# Patient Record
Sex: Female | Born: 1955 | Race: Black or African American | Hispanic: No | Marital: Single | Smoking: Current every day smoker
Health system: Southern US, Community
[De-identification: ages and names within clinical notes are randomized; demographics above are authoritative.]

## PROBLEM LIST (undated history)

## (undated) DIAGNOSIS — I10 Essential (primary) hypertension: Secondary | ICD-10-CM

## (undated) DIAGNOSIS — J189 Pneumonia, unspecified organism: Secondary | ICD-10-CM

## (undated) DIAGNOSIS — Q782 Osteopetrosis: Secondary | ICD-10-CM

## (undated) DIAGNOSIS — K219 Gastro-esophageal reflux disease without esophagitis: Secondary | ICD-10-CM

## (undated) HISTORY — DX: Gastro-esophageal reflux disease without esophagitis: K21.9

---

## 1985-04-19 HISTORY — PX: TUBAL LIGATION: SHX77

## 2004-02-01 ENCOUNTER — Emergency Department: Payer: Self-pay | Admitting: Emergency Medicine

## 2006-10-27 ENCOUNTER — Emergency Department: Payer: Self-pay | Admitting: Emergency Medicine

## 2006-10-27 ENCOUNTER — Other Ambulatory Visit: Payer: Self-pay

## 2014-10-21 ENCOUNTER — Emergency Department: Payer: Self-pay

## 2014-10-21 ENCOUNTER — Encounter: Payer: Self-pay | Admitting: *Deleted

## 2014-10-21 ENCOUNTER — Emergency Department
Admission: EM | Admit: 2014-10-21 | Discharge: 2014-10-22 | Disposition: A | Discharge status: Home / Self Care | Payer: Self-pay | Attending: Emergency Medicine | Admitting: Emergency Medicine

## 2014-10-21 DIAGNOSIS — R079 Chest pain, unspecified: Secondary | ICD-10-CM

## 2014-10-21 DIAGNOSIS — M791 Myalgia, unspecified site: Secondary | ICD-10-CM

## 2014-10-21 DIAGNOSIS — Z792 Long term (current) use of antibiotics: Secondary | ICD-10-CM | POA: Insufficient documentation

## 2014-10-21 DIAGNOSIS — J189 Pneumonia, unspecified organism: Secondary | ICD-10-CM

## 2014-10-21 DIAGNOSIS — Z880 Allergy status to penicillin: Secondary | ICD-10-CM | POA: Insufficient documentation

## 2014-10-21 DIAGNOSIS — R0789 Other chest pain: Secondary | ICD-10-CM | POA: Insufficient documentation

## 2014-10-21 DIAGNOSIS — M549 Dorsalgia, unspecified: Secondary | ICD-10-CM | POA: Insufficient documentation

## 2014-10-21 DIAGNOSIS — J159 Unspecified bacterial pneumonia: Secondary | ICD-10-CM | POA: Insufficient documentation

## 2014-10-21 DIAGNOSIS — Z720 Tobacco use: Secondary | ICD-10-CM | POA: Insufficient documentation

## 2014-10-21 NOTE — ED Notes (Signed)
Pt to triage via wheelchair.  Pt has chest pain for 1 week.  Pain radiates into back.  Pt has a cough.   Reports coughing up clear phelgm   cig smoker.  No n/v/d.

## 2014-10-22 LAB — COMPREHENSIVE METABOLIC PANEL
ALT: 25 U/L (ref 14–54)
AST: 29 U/L (ref 15–41)
Albumin: 4.2 g/dL (ref 3.5–5.0)
Alkaline Phosphatase: 65 U/L (ref 38–126)
Anion gap: 11 (ref 5–15)
BILIRUBIN TOTAL: 1.2 mg/dL (ref 0.3–1.2)
BUN: 9 mg/dL (ref 6–20)
CO2: 27 mmol/L (ref 22–32)
CREATININE: 0.65 mg/dL (ref 0.44–1.00)
Calcium: 9.4 mg/dL (ref 8.9–10.3)
Chloride: 103 mmol/L (ref 101–111)
GFR calc Af Amer: >60 mL/min (ref >60)
Glucose, Bld: 94 mg/dL (ref 65–99)
POTASSIUM: 3.9 mmol/L (ref 3.5–5.1)
Sodium: 141 mmol/L (ref 135–145)
Total Protein: 7.8 g/dL (ref 6.5–8.1)

## 2014-10-22 LAB — CBC
HCT: 45.2 % (ref 35.0–47.0)
Hemoglobin: 15 g/dL (ref 12.0–16.0)
MCH: 31.6 pg (ref 26.0–34.0)
MCHC: 33.1 g/dL (ref 32.0–36.0)
MCV: 95.3 fL (ref 80.0–100.0)
Platelets: 119 10*3/uL — ABNORMAL LOW (ref 150–440)
RBC: 4.74 MIL/uL (ref 3.80–5.20)
RDW: 14.8 % — ABNORMAL HIGH (ref 11.5–14.5)
WBC: 5.2 10*3/uL (ref 3.6–11.0)

## 2014-10-22 LAB — FIBRIN DERIVATIVES D-DIMER (ARMC ONLY): FIBRIN DERIVATIVES D-DIMER (ARMC): 402 (ref 0–499)

## 2014-10-22 LAB — TROPONIN I
Troponin I: <0.03 ng/mL (ref <0.031)
Troponin I: <0.03 ng/mL (ref <0.031)

## 2014-10-22 MED ORDER — TRAMADOL HCL 50 MG PO TABS: 50.0000 mg | ORAL_TABLET | Freq: Four times a day (QID) | ORAL | Status: DC | PRN

## 2014-10-22 MED ORDER — AZITHROMYCIN 250 MG PO TABS
500.0000 mg | ORAL_TABLET | Freq: Once | ORAL | Status: AC
  Administered 2014-10-22: 500 mg via ORAL

## 2014-10-22 MED ORDER — AZITHROMYCIN 250 MG PO TABS: ORAL_TABLET | ORAL | Status: AC

## 2014-10-22 MED ORDER — AZITHROMYCIN 250 MG PO TABS
ORAL_TABLET | ORAL | Status: AC
  Administered 2014-10-22: 500 mg via ORAL
  Filled 2014-10-22: qty 2

## 2014-10-22 MED ORDER — KETOROLAC TROMETHAMINE 30 MG/ML IJ SOLN
30.0000 mg | Freq: Once | INTRAMUSCULAR | Status: AC
  Administered 2014-10-22: 30 mg via INTRAVENOUS

## 2014-10-22 MED ORDER — KETOROLAC TROMETHAMINE 30 MG/ML IJ SOLN
INTRAMUSCULAR | Status: AC
  Administered 2014-10-22: 30 mg via INTRAVENOUS
  Filled 2014-10-22: qty 1

## 2014-10-22 NOTE — ED Provider Notes (Signed)
[facility] Emergency Department Provider Note  ____________________________________________  Time seen: Approximately 2324 PM  I have reviewed the triage vital signs and the nursing notes.   HISTORY  Chief Complaint Chest Pain    HPI Rebecca Graham is a 59 y.o. female who comes in with chest pain 2-3 weeks. 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 That she has been taking [employer] bran pain reliever and Motrin but it has not been helping the pain. She reports that his pain at the top of her chest and she does also have some pain in her back. The patient reports that it hurts to cough as well as to breathe. She reports that she's had some sweats and hot flashes as well as a cough with some mild shortness of breath. The patient and her family were concerned so they decided to come in for further evaluation.   History reviewed. No pertinent past medical history.  There are no active problems to display for this patient.   History reviewed. No pertinent past surgical history.  Current Outpatient Rx  Name  Route  Sig  Dispense  Refill  . azithromycin (ZITHROMAX Z-PAK) 250 MG tablet      Take 2 tablets (500 mg) on  Day 1,  followed by 1 tablet (250 mg) once daily on Days 2 through 5.   6 each   0   . traMADol (ULTRAM) 50 MG tablet   Oral   Take 1 tablet (50 mg total) by mouth every 6 (six) hours as needed.   12 tablet   0     Allergies Aspirin and Penicillins  History reviewed. No pertinent family history.  Social History History  Substance Use Topics  . Smoking status: Current Every Day Smoker  . Smokeless tobacco: Not on file  . Alcohol Use: Yes    Review of Systems Constitutional: Sweats with No fever/chills Eyes: No visual changes. ENT: No sore throat. Cardiovascular: chest pain. Respiratory:  shortness of  breath. Gastrointestinal: No abdominal pain.  No nausea, no vomiting.  No diarrhea.  No constipation. Genitourinary: Negative for dysuria. Musculoskeletal: back pain and myalgias Skin: Negative for rash. Neurological: Negative for headaches  10-point ROS otherwise negative.  ____________________________________________   PHYSICAL EXAM:  VITAL SIGNS: ED Triage Vitals  Enc Vitals Group     BP 10/21/14 2305 83/57 mmHg     Pulse Rate 10/21/14 2305 82     Resp 10/21/14 2305 22     Temp 10/21/14 2305 98.2 F (36.8 C)     Temp Source 10/21/14 2305 Oral     SpO2 10/21/14 2305 96 %     Weight 10/21/14 2305 135 lb (61.236 kg)     Height 10/21/14 2305 5\' 5"  (1.651 m)     Head Cir --      Peak Flow --      Pain Score 10/21/14 2307 10     Pain Loc --      Pain Edu? --      Excl. in GC? --     Constitutional: Alert and oriented. Well appearing and in moderate distress. Eyes: Conjunctivae are normal. PERRL. EOMI. Head: Atraumatic. Nose: No congestion/rhinnorhea. Mouth/Throat: Mucous membranes are moist.  Oropharynx non-erythematous. Cardiovascular: Normal rate, regular rhythm. Grossly normal heart sounds.  Good peripheral circulation. Respiratory: Normal respiratory effort.  No  retractions. Lungs CTAB. Gastrointestinal: Soft and nontender. No distention. Active bowel sounds Genitourinary: Deferred Musculoskeletal: Tenderness to palpation of back and of chest wall Neurologic:  Normal speech and language. No gross focal neurologic deficits are appreciated.  Skin:  Skin is warm, dry and intact. No rash noted. Psychiatric: Mood and affect are normal.   ____________________________________________   LABS (all labs ordered are listed, but only abnormal results are displayed)  Labs Reviewed  CBC - Abnormal; Notable for the following:    RDW 14.8 (*)    Platelets 119 (*)    All other components within normal limits  COMPREHENSIVE METABOLIC PANEL  TROPONIN I  FIBRIN DERIVATIVES  D-DIMER ([facility] ONLY)  TROPONIN I   ____________________________________________  EKG  ED ECG REPORT I, Rebecka Apley, the attending physician, personally viewed and interpreted this ECG.   Date: 10/22/2014  EKG Time: 2304  Rate: 74  Rhythm: normal EKG, normal sinus rhythm  Axis: normal  Intervals:none  ST&T Change: normal  ____________________________________________  RADIOLOGY  Chest x-ray: Right mid lung airspace opacity raises concern for mild pneumonia ____________________________________________   PROCEDURES  Procedure(s) performed: None  Critical Care performed: No  ____________________________________________   INITIAL IMPRESSION / ASSESSMENT AND PLAN / ED COURSE  Pertinent labs & imaging results that were available during my care of the patient were reviewed by me and considered in my medical decision making (see chart for details).  The patient is a 59 year old female who comes in with chest pain for multiple weeks with ultimate other symptoms. Patient's blood work is unremarkable at this time. I did give the patient some Toradol for discomfort as well as some azithromycin for her pneumonia. The patient's d-dimer is also negative. I will reassess the patient and repeat a troponin to determine if this may be cardiac in nature. The patient's blood pressure after arrival to the room was unremarkable without intervention.  ----------------------------------------- 3:49 AM on 10/22/2014 -----------------------------------------  The patient reports that the Toradol did significantly improve her pain. Patient also received a dose of azithromycin without any ill effects. The patient did have a repeat troponin that was unremarkable. I informed the patient of the pneumonia found on her chest x-ray and the muscle aches that she feels is likely from the myalgias. The patient be discharged home and informed [facility] her  symptoms are improving. The patient has no further complaints or concerns over discharged home. ____________________________________________   FINAL CLINICAL IMPRESSION(S) / ED DIAGNOSES  Final diagnoses:  Chest pain, unspecified chest pain type  Community acquired pneumonia  Myalgia      Rebecka Apley, MD 10/22/14 [phone]

## 2014-10-22 NOTE — Discharge Instructions (Signed)
Chest Pain (Nonspecific) °It is often hard to give a specific diagnosis for the cause of chest pain. There is always a chance that your pain could be related to something serious, such as a heart attack or a blood clot in the lungs. You need to follow up with your health care provider for further evaluation. °CAUSES  °· Heartburn. °· Pneumonia or bronchitis. °· Anxiety or stress. °· Inflammation around your heart (pericarditis) or lung (pleuritis or pleurisy). °· A blood clot in the lung. °· A collapsed lung (pneumothorax). It can develop suddenly on its own (spontaneous pneumothorax) or from trauma to the chest. °· Shingles infection (herpes zoster virus). °The chest wall is composed of bones, muscles, and cartilage. Any of these can be the source of the pain. °· The bones can be bruised by injury. °· The muscles or cartilage can be strained by coughing or overwork. °· The cartilage can be affected by inflammation and become sore (costochondritis). °DIAGNOSIS  °Lab tests or other studies may be needed to find the cause of your pain. Your health care provider may have you take a test called an ambulatory electrocardiogram (ECG). An ECG records your heartbeat patterns over a 24-hour period. You may also have other tests, such as: °· Transthoracic echocardiogram (TTE). During echocardiography, sound waves are used to evaluate how blood flows through your heart. °· Transesophageal echocardiogram (TEE). °· Cardiac monitoring. This allows your health care provider to monitor your heart rate and rhythm in real time. °· Holter monitor. This is a portable device that records your heartbeat and can help diagnose heart arrhythmias. It allows your health care provider to track your heart activity for several days, if needed. °· Stress tests by exercise or by giving medicine that makes the heart beat faster. °TREATMENT  °· Treatment depends on what may be causing your chest pain. Treatment may include: °· Acid blockers for  heartburn. °· Anti-inflammatory medicine. °· Pain medicine for inflammatory conditions. °· Antibiotics if an infection is present. °· You may be advised to change lifestyle habits. This includes stopping smoking and avoiding alcohol, caffeine, and chocolate. °· You may be advised to keep your head raised (elevated) when sleeping. This reduces the chance of acid going backward from your stomach into your esophagus. °Most of the time, nonspecific chest pain will improve within 2-3 days with rest and mild pain medicine.  °HOME CARE INSTRUCTIONS  °· If antibiotics were prescribed, take them as directed. Finish them even if you start to feel better. °· For the next few days, avoid physical activities that bring on chest pain. Continue physical activities as directed. °· Do not use any tobacco products, including cigarettes, chewing tobacco, or electronic cigarettes. °· Avoid drinking alcohol. °· Only take medicine as directed by your health care provider. °· Follow your health care provider's suggestions for further testing if your chest pain does not go away. °· Keep any follow-up appointments you made. If you do not go to an appointment, you could develop lasting (chronic) problems with pain. If there is any problem keeping an appointment, call to reschedule. °SEEK MEDICAL CARE IF:  °· Your chest pain does not go away, even after treatment. °· You have a rash with blisters on your chest. °· You have a fever. °SEEK IMMEDIATE MEDICAL CARE IF:  °· You have increased chest pain or pain that spreads to your arm, neck, jaw, back, or abdomen. °· You have shortness of breath. °· You have an increasing cough, or you cough   up blood.  You have severe back or abdominal pain.  You feel nauseous or vomit.  You have severe weakness.  You faint.  You have chills. This is an emergency. Do not wait to see if the pain will go away. Get medical help at once. Call your local emergency services (911 in [place] Do not drive  yourself to the hospital. MAKE SURE YOU:   Understand these instructions.  Will watch your condition.  Will get help right away if you are not doing well or get worse. Document Released: 01/13/2005 Document Revised: 04/10/2013 Document Reviewed: 11/09/2007 [facility] Patient Information 2015 [employer], [place]. This information is not intended to replace advice given to you by your health care provider. Make sure you discuss any questions you have with your health care provider.  Muscle Pain Muscle pain (myalgia) may be caused by many things, including:  Overuse or muscle strain, especially if you are not in shape. This is the most common cause of muscle pain.  Injury.  Bruises.  Viruses, such as the flu.  Infectious diseases.  Fibromyalgia, which is a chronic condition that causes muscle tenderness, fatigue, and headache.  Autoimmune diseases, including lupus.  Certain drugs, including ACE inhibitors and statins. Muscle pain may be mild or severe. In most cases, the pain lasts only a short time and goes away without treatment. To diagnose the cause of your muscle pain, your health care provider will take your medical history. This means he or she will ask you when your muscle pain began and what has been happening. If you have not had muscle pain for very long, your health care provider may want to wait before doing much testing. If your muscle pain has lasted a long time, your health care provider may want to run tests right away. If your health care provider thinks your muscle pain may be caused by illness, you may need to have additional tests to rule out certain conditions.  Treatment for muscle pain depends on the cause. Home care is often enough to relieve muscle pain. Your health care provider may also prescribe anti-inflammatory medicine. HOME CARE INSTRUCTIONS Watch your condition for any changes. The following actions may help to lessen any discomfort you are feeling:  Only  take over-the-counter or prescription medicines as directed by your health care provider.  Apply ice to the sore muscle:  Put ice in a plastic bag.  Place a towel between your skin and the bag.  Leave the ice on for 15-20 minutes, 3-4 times a day.  You may alternate applying hot and cold packs to the muscle as directed by your health care provider.  If overuse is causing your muscle pain, slow down your activities until the pain goes away.  Remember that it is normal to feel some muscle pain after starting a workout program. Muscles that have not been used often will be sore at first.  Do regular, gentle exercises if you are not usually active.  Warm up before exercising to lower your risk of muscle pain.  Do not continue working out if the pain is very bad. Bad pain could mean you have injured a muscle. SEEK MEDICAL CARE IF:  Your muscle pain gets worse, and medicines do not help.  You have muscle pain that lasts longer than 3 days.  You have a rash or fever along with muscle pain.  You have muscle pain after a tick bite.  You have muscle pain while working out, even though you are  in good physical condition.  You have redness, soreness, or swelling along with muscle pain.  You have muscle pain after starting a new medicine or changing the dose of a medicine. SEEK IMMEDIATE MEDICAL CARE IF:  You have trouble breathing.  You have trouble swallowing.  You have muscle pain along with a stiff neck, fever, and vomiting.  You have severe muscle weakness or cannot move part of your body. MAKE SURE YOU:   Understand these instructions.  Will watch your condition.  Will get help right away if you are not doing well or get worse. Document Released: 02/25/2006 Document Revised: 04/10/2013 Document Reviewed: 01/30/2013 [facility] Patient Information 2015 [place], [place]. This information is not intended to replace advice given to you by your health care provider. Make sure you  discuss any questions you have with your health care provider.  Pneumonia Pneumonia is an infection of the lungs.  CAUSES Pneumonia may be caused by bacteria or a virus. Usually, these infections are caused by breathing infectious particles into the lungs (respiratory tract). SIGNS AND SYMPTOMS   Cough.  Fever.  Chest pain.  Increased rate of breathing.  Wheezing.  Mucus production. DIAGNOSIS  If you have the common symptoms of pneumonia, your health care provider will typically confirm the diagnosis with a chest X-ray. The X-ray will show an abnormality in the lung (pulmonary infiltrate) if you have pneumonia. Other tests of your blood, urine, or sputum may be done to find the specific cause of your pneumonia. Your health care provider may also do tests (blood gases or pulse oximetry) to see how well your lungs are working. TREATMENT  Some forms of pneumonia may be spread to other people when you cough or sneeze. You may be asked to wear a mask before and during your exam. Pneumonia that is caused by bacteria is treated with antibiotic medicine. Pneumonia that is caused by the influenza virus may be treated with an antiviral medicine. Most other viral infections must run their course. These infections will not respond to antibiotics.  HOME CARE INSTRUCTIONS   Cough suppressants may be used if you are losing too much rest. However, coughing protects you by clearing your lungs. You should avoid using cough suppressants if you can.  Your health care provider may have prescribed medicine if he or she thinks your pneumonia is caused by bacteria or influenza. Finish your medicine even if you start to feel better.  Your health care provider may also prescribe an expectorant. This loosens the mucus to be coughed up.  Take medicines only as directed by your health care provider.  Do not smoke. Smoking is a common cause of bronchitis and can contribute to pneumonia. If you are a smoker and  continue to smoke, your cough may last several weeks after your pneumonia has cleared.  A cold steam vaporizer or humidifier in your room or home may help loosen mucus.  Coughing is often worse at night. Sleeping in a semi-upright position in a recliner or using a couple pillows under your head will help with this.  Get rest as you feel it is needed. Your body will usually let you know when you need to rest. PREVENTION A pneumococcal shot (vaccine) is available to prevent a common bacterial cause of pneumonia. This is usually suggested for:  People over 64 years old.  Patients on chemotherapy.  People with chronic lung problems, such as bronchitis or emphysema.  People with immune system problems. If you are over 65 or  have a high risk condition, you may receive the pneumococcal vaccine if you have not received it before. In some countries, a routine influenza vaccine is also recommended. This vaccine can help prevent some cases of pneumonia.You may be offered the influenza vaccine as part of your care. If you smoke, it is time to quit. You may receive instructions on how to stop smoking. Your health care provider can provide medicines and counseling to help you quit. SEEK MEDICAL CARE IF: You have a fever. SEEK IMMEDIATE MEDICAL CARE IF:   Your illness becomes worse. This is especially true if you are elderly or weakened from any other disease.  You cannot control your cough with suppressants and are losing sleep.  You begin coughing up blood.  You develop pain which is getting worse or is uncontrolled with medicines.  Any of the symptoms which initially brought you in for treatment are getting worse rather than better.  You develop shortness of breath or chest pain. MAKE SURE YOU:   Understand these instructions.  Will watch your condition.  Will get help right away if you are not doing well or get worse. Document Released: 04/05/2005 Document Revised: 08/20/2013 Document  Reviewed: 06/25/2010 [facility] Patient Information 2015 [place], [place]. This information is not intended to replace advice given to you by your health care provider. Make sure you discuss any questions you have with your health care provider.

## 2014-10-24 ENCOUNTER — Ambulatory Visit: Payer: Self-pay

## 2014-10-25 ENCOUNTER — Other Ambulatory Visit: Payer: Self-pay | Admitting: Urology

## 2014-10-25 DIAGNOSIS — J189 Pneumonia, unspecified organism: Secondary | ICD-10-CM

## 2014-10-31 ENCOUNTER — Ambulatory Visit
Admission: RE | Admit: 2014-10-31 | Discharge: 2014-10-31 | Disposition: A | Discharge status: Home / Self Care | Payer: PRIVATE HEALTH INSURANCE | Source: Ambulatory Visit | Attending: Urology | Admitting: Urology

## 2014-10-31 DIAGNOSIS — J189 Pneumonia, unspecified organism: Secondary | ICD-10-CM | POA: Diagnosis present

## 2014-11-01 ENCOUNTER — Emergency Department: Payer: PRIVATE HEALTH INSURANCE

## 2014-11-01 ENCOUNTER — Other Ambulatory Visit: Payer: Self-pay

## 2014-11-01 ENCOUNTER — Emergency Department
Admission: EM | Admit: 2014-11-01 | Discharge: 2014-11-02 | Disposition: A | Discharge status: Home / Self Care | Payer: PRIVATE HEALTH INSURANCE | Attending: Emergency Medicine | Admitting: Emergency Medicine

## 2014-11-01 ENCOUNTER — Encounter: Payer: Self-pay | Admitting: Emergency Medicine

## 2014-11-01 DIAGNOSIS — Z87891 Personal history of nicotine dependence: Secondary | ICD-10-CM | POA: Insufficient documentation

## 2014-11-01 DIAGNOSIS — R091 Pleurisy: Secondary | ICD-10-CM

## 2014-11-01 DIAGNOSIS — R0602 Shortness of breath: Secondary | ICD-10-CM | POA: Insufficient documentation

## 2014-11-01 DIAGNOSIS — R0789 Other chest pain: Secondary | ICD-10-CM

## 2014-11-01 DIAGNOSIS — Z880 Allergy status to penicillin: Secondary | ICD-10-CM | POA: Insufficient documentation

## 2014-11-01 DIAGNOSIS — R110 Nausea: Secondary | ICD-10-CM | POA: Insufficient documentation

## 2014-11-01 LAB — COMPREHENSIVE METABOLIC PANEL
ALK PHOS: 71 U/L (ref 38–126)
ALT: 59 U/L — ABNORMAL HIGH (ref 14–54)
AST: 77 U/L — ABNORMAL HIGH (ref 15–41)
Albumin: 4.4 g/dL (ref 3.5–5.0)
Anion gap: 14 (ref 5–15)
BUN: 24 mg/dL — AB (ref 6–20)
CO2: 16 mmol/L — ABNORMAL LOW (ref 22–32)
Calcium: 9.5 mg/dL (ref 8.9–10.3)
Chloride: 101 mmol/L (ref 101–111)
Creatinine, Ser: 1.34 mg/dL — ABNORMAL HIGH (ref 0.44–1.00)
GFR calc Af Amer: 49 mL/min — ABNORMAL LOW (ref >60)
GFR, EST NON AFRICAN AMERICAN: 42 mL/min — AB (ref >60)
GLUCOSE: 164 mg/dL — AB (ref 65–99)
POTASSIUM: 3.3 mmol/L — AB (ref 3.5–5.1)
Sodium: 131 mmol/L — ABNORMAL LOW (ref 135–145)
Total Bilirubin: 0.5 mg/dL (ref 0.3–1.2)
Total Protein: 8.5 g/dL — ABNORMAL HIGH (ref 6.5–8.1)

## 2014-11-01 LAB — CBC
HEMATOCRIT: 42.9 % (ref 35.0–47.0)
Hemoglobin: 14 g/dL (ref 12.0–16.0)
MCH: 30.3 pg (ref 26.0–34.0)
MCHC: 32.6 g/dL (ref 32.0–36.0)
MCV: 92.8 fL (ref 80.0–100.0)
PLATELETS: 130 10*3/uL — AB (ref 150–440)
RBC: 4.62 MIL/uL (ref 3.80–5.20)
RDW: 14.7 % — ABNORMAL HIGH (ref 11.5–14.5)
WBC: 3.8 10*3/uL (ref 3.6–11.0)

## 2014-11-01 LAB — TROPONIN I: Troponin I: <0.03 ng/mL (ref <0.031)

## 2014-11-01 MED ORDER — POTASSIUM CHLORIDE CRYS ER 20 MEQ PO TBCR
40.0000 meq | EXTENDED_RELEASE_TABLET | Freq: Once | ORAL | Status: AC
  Administered 2014-11-01: 40 meq via ORAL
  Filled 2014-11-01: qty 2

## 2014-11-01 MED ORDER — HYDROCODONE-ACETAMINOPHEN 5-325 MG PO TABS
1.0000 | ORAL_TABLET | Freq: Once | ORAL | Status: AC
  Administered 2014-11-01: 1 via ORAL
  Filled 2014-11-01: qty 1

## 2014-11-01 MED ORDER — IBUPROFEN 600 MG PO TABS: 600.0000 mg | ORAL_TABLET | Freq: Three times a day (TID) | ORAL | Status: DC | PRN

## 2014-11-01 MED ORDER — METHYLPREDNISOLONE 4 MG PO TBPK: ORAL_TABLET | ORAL | Status: DC

## 2014-11-01 MED ORDER — HYDROCODONE-ACETAMINOPHEN 5-325 MG PO TABS: 1.0000 | ORAL_TABLET | Freq: Four times a day (QID) | ORAL | Status: DC | PRN

## 2014-11-01 MED ORDER — KETOROLAC TROMETHAMINE 30 MG/ML IJ SOLN
60.0000 mg | Freq: Once | INTRAMUSCULAR | Status: AC
  Administered 2014-11-01: 60 mg via INTRAMUSCULAR
  Filled 2014-11-01: qty 2

## 2014-11-01 NOTE — ED Notes (Signed)
Pt states left and central chest pain that began 2 days pta. Pt states is worse with movement, pt states was recently diagnosed with pneumonia and is currently coughing. Pt states feels shob. Pt states has had itnermittent nausea, denies lightheadedness.

## 2014-11-01 NOTE — Discharge Instructions (Signed)
1. Start Medrol Dosepak as prescribed. 2. Take pain medicines as needed (Motrin/Norco #15). 3. Apply moist heat to affected area several times daily. 4. Return to the ER for worsening symptoms, fever, difficulty breathing or other concerns.  Chest Pain (Nonspecific) It is often hard to give a diagnosis for the cause of chest pain. There is always a chance that your pain could be related to something serious, such as a heart attack or a blood clot in the lungs. You need to follow up with your doctor. HOME CARE  If antibiotic medicine was given, take it as directed by your doctor. Finish the medicine even if you start to feel better.  For the next few days, avoid activities that bring on chest pain. Continue physical activities as told by your doctor.  Do not use any tobacco products. This includes cigarettes, chewing tobacco, and e-cigarettes.  Avoid drinking alcohol.  Only take medicine as told by your doctor.  Follow your doctor's suggestions for more testing if your chest pain does not go away.  Keep all doctor visits you made. GET HELP IF:  Your chest pain does not go away, even after treatment.  You have a rash with blisters on your chest.  You have a fever. GET HELP RIGHT AWAY IF:   You have more pain or pain that spreads to your arm, neck, jaw, back, or belly (abdomen).  You have shortness of breath.  You cough more than usual or cough up blood.  You have very bad back or belly pain.  You feel sick to your stomach (nauseous) or throw up (vomit).  You have very bad weakness.  You pass out (faint).  You have chills. This is an emergency. Do not wait to see if the problems will go away. Call your local emergency services (911 in [place] Do not drive yourself to the hospital. MAKE SURE YOU:   Understand these instructions.  Will watch your condition.  Will get help right away if you are not doing well or get worse. Document Released: 09/22/2007 Document Revised:  04/10/2013 Document Reviewed: 09/22/2007 [facility] Patient Information 2015 [place], [place]. This information is not intended to replace advice given to you by your health care provider. Make sure you discuss any questions you have with your health care provider.  Chest Wall Pain Chest wall pain is pain in or around the bones and muscles of your chest. It may take up to 6 weeks to get better. It may take longer if you must stay physically active in your work and activities.  CAUSES  Chest wall pain may happen on its own. However, it may be caused by:  A viral illness like the flu.  Injury.  Coughing.  Exercise.  Arthritis.  Fibromyalgia.  Shingles. HOME CARE INSTRUCTIONS   Avoid overtiring physical activity. Try not to strain or perform activities that cause pain. This includes any activities using your chest or your abdominal and side muscles, especially if heavy weights are used.  Put ice on the sore area.  Put ice in a plastic bag.  Place a towel between your skin and the bag.  Leave the ice on for 15-20 minutes per hour while awake for the first 2 days.  Only take over-the-counter or prescription medicines for pain, discomfort, or fever as directed by your caregiver. SEEK IMMEDIATE MEDICAL CARE IF:   Your pain increases, or you are very uncomfortable.  You have a fever.  Your chest pain becomes worse.  You have new,  unexplained symptoms.  You have nausea or vomiting.  You feel sweaty or lightheaded.  You have a cough with phlegm (sputum), or you cough up blood. MAKE SURE YOU:   Understand these instructions.  Will watch your condition.  Will get help right away if you are not doing well or get worse. Document Released: 04/05/2005 Document Revised: 06/28/2011 Document Reviewed: 11/30/2010 [facility] Patient Information 2015 [place], [place]. This information is not intended to replace advice given to you by your health care provider. Make sure you discuss any  questions you have with your health care provider.  Pleurisy Pleurisy is an inflammation and swelling of the lining of the lungs (pleura). Because of this inflammation, it hurts to breathe. It can be aggravated by coughing, laughing, or deep breathing. Pleurisy is often caused by an underlying infection or disease.  HOME CARE INSTRUCTIONS  Monitor your pleurisy for any changes. The following actions may help to alleviate any discomfort you are experiencing:  Medicine may help with pain. Only take over-the-counter or prescription medicines for pain, discomfort, or fever as directed by your health care provider.  Only take antibiotic medicine as directed. Make sure to finish it even if you start to feel better. SEEK MEDICAL CARE IF:   Your pain is not controlled with medicine or is increasing.  You have an increase in pus-like (purulent) secretions brought up with coughing. SEEK IMMEDIATE MEDICAL CARE IF:   You have blue or dark lips, fingernails, or toenails.  You are coughing up blood.  You have increased difficulty breathing.  You have continuing pain unrelieved by medicine or pain lasting more than 1 week.  You have pain that radiates into your neck, arms, or jaw.  You develop increased shortness of breath or wheezing.  You develop a fever, rash, vomiting, fainting, or other serious symptoms. MAKE SURE YOU:  Understand these instructions.   Will watch your condition.   Will get help right away if you are not doing well or get worse.  Document Released: 04/05/2005 Document Revised: 12/06/2012 Document Reviewed: 09/17/2012 [facility] Patient Information 2015 [place]. This information is not intended to replace advice given to you by your health care provider. Make sure you discuss any questions you have with your health care provider.

## 2014-11-01 NOTE — ED Provider Notes (Signed)
[facility] Emergency Department Provider Note  ____________________________________________  Time seen: Approximately 11:07 PM  I have reviewed the triage vital signs and the nursing notes.   HISTORY  Chief Complaint Chest Pain    HPI Rebecca Graham is a 59 y.o. female who presents to the ED from home with central chest soreness 2 days. Patient was recently diagnosed on July 5 with pneumonia, finished Z-Pak. Patient complains of 8/10 chest aching which is worse with movement and nonproductive cough with occasional nausea and shortness of breath. Patient denies fever, chills, sweating, vomiting, palpitations.States tramadol does not alleviate chest pain.   Past medical history Denies CAD  There are no active problems to display for this patient.   No past surgical history on file.  Current Outpatient Rx  Name  Route  Sig  Dispense  Refill  . traMADol (ULTRAM) 50 MG tablet   Oral   Take 1 tablet (50 mg total) by mouth every 6 (six) hours as needed.   12 tablet   0     Allergies Aspirin and Penicillins  No family history on file.  Social History History  Substance Use Topics  . Smoking status: Former Games developer  . Smokeless tobacco: Not on file  . Alcohol Use: Yes    Review of Systems Constitutional: No fever/chills Eyes: No visual changes. ENT: No sore throat. Cardiovascular: Positive for chest pain. Respiratory: Positive for residual nonproductive cough which is overall improved and occasional shortness of breath. Gastrointestinal: No abdominal pain.  No nausea, no vomiting.  No diarrhea.  No constipation. Genitourinary: Negative for dysuria. Musculoskeletal: Negative for back pain. Skin: Negative for rash. Neurological: Negative for headaches, focal weakness or numbness.  10-point ROS otherwise negative.  ____________________________________________   PHYSICAL EXAM:  VITAL SIGNS: ED Triage Vitals  Enc Vitals Group     BP  11/01/14 2003 117/81 mmHg     Pulse Rate 11/01/14 2003 83     Resp 11/01/14 2003 18     Temp 11/01/14 2003 99 F (37.2 C)     Temp Source 11/01/14 2003 Oral     SpO2 11/01/14 2003 100 %     Weight 11/01/14 2003 145 lb (65.772 kg)     Height 11/01/14 2003  (1.702 m)     Head Cir --      Peak Flow --      Pain Score 11/01/14 2004 8     Pain Loc --      Pain Edu? --      Excl. in GC? --     Constitutional: Alert and oriented. Well appearing and in no acute distress. Eyes: Conjunctivae are normal. PERRL. EOMI. Head: Atraumatic. Nose: No congestion/rhinnorhea. Mouth/Throat: Mucous membranes are moist.  Oropharynx non-erythematous. Neck: No stridor.   Cardiovascular: Normal rate, regular rhythm. Grossly normal heart sounds.  Good peripheral circulation. Respiratory: Normal respiratory effort.  No retractions. Lungs CTAB. Anterior chest wall tender to palpation and with movement of trunk. Gastrointestinal: Soft and nontender. No distention. No abdominal bruits. No CVA tenderness. Musculoskeletal: No lower extremity tenderness nor edema.  No joint effusions. Neurologic:  Normal speech and language. No gross focal neurologic deficits are appreciated. No gait instability. Skin:  Skin is warm, dry and intact. No rash noted. Psychiatric: Mood and affect are normal. Speech and behavior are normal.  ____________________________________________   LABS (all labs ordered are listed, but only abnormal results are displayed)  Labs Reviewed  CBC - Abnormal; Notable for the following:  RDW 14.7 (*)    Platelets 130 (*)    All other components within normal limits  COMPREHENSIVE METABOLIC PANEL - Abnormal; Notable for the following:    Sodium 131 (*)    Potassium 3.3 (*)    CO2 16 (*)    Glucose, Bld 164 (*)    BUN 24 (*)    Creatinine, Ser 1.34 (*)    Total Protein 8.5 (*)    AST 77 (*)    ALT 59 (*)    GFR calc non Af Amer 42 (*)    GFR calc Af Amer 49 (*)    All other  components within normal limits  TROPONIN I   ____________________________________________  EKG  ED ECG REPORT I, Alize Acy J, the attending physician, personally viewed and interpreted this ECG.   Date: 11/01/2014  EKG Time: 1959  Rate: 87  Rhythm: normal EKG, normal sinus rhythm  Axis: Normal  Intervals:none  ST&T Change: Nonspecific  ____________________________________________  RADIOLOGY  Chest 2 view interpreted per Dr. Register:  Interim near complete clearing of right middle lobe pneumonia. ____________________________________________   PROCEDURES  Procedure(s) performed: None  Critical Care performed: No  ____________________________________________   INITIAL IMPRESSION / ASSESSMENT AND PLAN / ED COURSE  Pertinent labs & imaging results that were available during my care of the patient were reviewed by me and considered in my medical decision making (see chart for details).  59 year old female with complains of chest pain with recent pneumonia improved with azithromycin. Labs notable for mild hypokalemia. Chest pain is reproducible in nature; low suspicion for acute coronary syndrome or pulmonary embolus. Feel most likely pleurisy and spoke with patient regarding starting Medrol Dosepak in addition to NSAIDs and analgesics. Strict return precautions given. Patient and family verbalized understanding and agree with plan of care. ____________________________________________   FINAL CLINICAL IMPRESSION(S) / ED DIAGNOSES  Final diagnoses:  Pleurisy  Chest wall pain      Irean HongJade J Dastan Krider, MD 11/03/14 0327

## 2014-11-07 ENCOUNTER — Ambulatory Visit: Payer: Self-pay

## 2014-11-12 ENCOUNTER — Other Ambulatory Visit: Payer: Self-pay

## 2014-11-12 LAB — CBC AND DIFFERENTIAL
HCT: 40 % (ref 36–46)
Hemoglobin: 13 g/dL (ref 12.0–16.0)
Neutrophils Absolute: 3 /uL
Platelets: 228 10*3/uL (ref 150–399)
WBC: 5.6 10^3/mL

## 2014-11-12 LAB — HEPATIC FUNCTION PANEL
ALT: 41 U/L — AB (ref 7–35)
AST: 21 U/L (ref 13–35)
Alkaline Phosphatase: 61 U/L (ref 25–125)
BILIRUBIN, TOTAL: 0.4 mg/dL

## 2014-11-20 ENCOUNTER — Emergency Department: Payer: Worker's Compensation

## 2014-11-20 ENCOUNTER — Encounter: Payer: Self-pay | Admitting: *Deleted

## 2014-11-20 ENCOUNTER — Emergency Department
Admission: EM | Admit: 2014-11-20 | Discharge: 2014-11-20 | Disposition: A | Discharge status: Home / Self Care | Payer: Worker's Compensation | Attending: Emergency Medicine | Admitting: Emergency Medicine

## 2014-11-20 DIAGNOSIS — Y990 Civilian activity done for income or pay: Secondary | ICD-10-CM | POA: Insufficient documentation

## 2014-11-20 DIAGNOSIS — Y9389 Activity, other specified: Secondary | ICD-10-CM | POA: Diagnosis not present

## 2014-11-20 DIAGNOSIS — Y9289 Other specified places as the place of occurrence of the external cause: Secondary | ICD-10-CM | POA: Diagnosis not present

## 2014-11-20 DIAGNOSIS — W1839XA Other fall on same level, initial encounter: Secondary | ICD-10-CM | POA: Insufficient documentation

## 2014-11-20 DIAGNOSIS — S20212A Contusion of left front wall of thorax, initial encounter: Secondary | ICD-10-CM | POA: Insufficient documentation

## 2014-11-20 DIAGNOSIS — Z880 Allergy status to penicillin: Secondary | ICD-10-CM | POA: Insufficient documentation

## 2014-11-20 DIAGNOSIS — S299XXA Unspecified injury of thorax, initial encounter: Secondary | ICD-10-CM | POA: Diagnosis present

## 2014-11-20 DIAGNOSIS — Z720 Tobacco use: Secondary | ICD-10-CM | POA: Insufficient documentation

## 2014-11-20 HISTORY — DX: Pneumonia, unspecified organism: J18.9

## 2014-11-20 MED ORDER — HYDROCODONE-ACETAMINOPHEN 5-325 MG PO TABS
1.0000 | ORAL_TABLET | ORAL | Status: DC | PRN
Start: 2014-11-20 — End: 2017-07-26

## 2014-11-20 NOTE — ED Notes (Signed)
Pt reports while at work this morning she was lifting a heavy tub when it fell back onto her left side - pt now c/o left rib pain, concerned she may have a fracture. Pt grimacing and guarding, no acute distress, speaking complete sentences.

## 2014-11-20 NOTE — Discharge Instructions (Signed)
Chest Contusion A contusion is a deep bruise. Bruises happen when an injury causes bleeding under the skin. Signs of bruising include pain, puffiness (swelling), and discolored skin. The bruise may turn blue, purple, or yellow.  HOME CARE  Put ice on the injured area.  Put ice in a plastic bag.  Place a towel between the skin and the bag.  Leave the ice on for 15-20 minutes at a time, 03-04 times a day for the first 48 hours.  Only take medicine as told by your doctor.  Rest.  Take deep breaths (deep-breathing exercises) as told by your doctor.  Stop smoking if you smoke.  Do not lift objects over 5 pounds (2.3 kilograms) for 3 days or longer if told by your doctor. GET HELP RIGHT AWAY IF:   You have more bruising or puffiness.  You have pain that gets worse.  You have trouble breathing.  You are dizzy, weak, or pass out (faint).  You have blood in your pee (urine) or poop (stool).  You cough up or throw up (vomit) blood.  Your puffiness or pain is not helped with medicines. MAKE SURE YOU:   Understand these instructions.  Will watch your condition.  Will get help right away if you are not doing well or get worse. Document Released: 09/22/2007 Document Revised: 12/29/2011 Document Reviewed: 09/27/2011 [facility] Patient Information 2015 [place], [place]. This information is not intended to replace advice given to you by your health care provider. Make sure you discuss any questions you have with your health care provider.    ICE TO CHEST FOR COMFORT NORCO AS NEEDED FOR PAIN FOLLOW UP WITH YOUR DOCTOR IF ANY CONTINUED PROBLEMS IN ONE WEEK

## 2014-11-20 NOTE — ED Notes (Signed)
NAD noted at time of D/C. Pt taken to the lobby via wheelchair at time of D/C. Pt denies comments/concerns at this time.

## 2014-11-20 NOTE — ED Notes (Signed)
Company rep. Elijah Birk Scoggins advised the company required no post accident testing for [employer].

## 2014-11-20 NOTE — ED Provider Notes (Signed)
[facility] Emergency Department Provider Note  ____________________________________________  Time seen: 7:17 AM  I have reviewed the triage vital signs and the nursing notes.   HISTORY  Chief Complaint Rib Injury   HPI Rebecca Graham is a 59 y.o. female is here for his comp injury. Company representative is also here with the patient stating that there is no drug screen needed. This morning while at work patient bumped into a tub with her left ribs. There is concern for a rib fracture. There was no head injury or loss of consciousness. She states it does hurt to take deep breaths.She denies any prior rib fractures. Currently her pain is 9 out of 10. History is also positive for smoking one to 2-3 cigarettes per day.   Past Medical History  Diagnosis Date  . Pneumonia     There are no active problems to display for this patient.   History reviewed. No pertinent past surgical history.  Current Outpatient Rx  Name  Route  Sig  Dispense  Refill  . HYDROcodone-acetaminophen (NORCO/VICODIN) 5-325 MG per tablet   Oral   Take 1 tablet by mouth every 4 (four) hours as needed for moderate pain.   20 tablet   0   . methylPREDNISolone (MEDROL DOSEPAK) 4 MG TBPK tablet      Take as directed   21 tablet   0     Allergies Aspirin and Penicillins  History reviewed. No pertinent family history.  Social History History  Substance Use Topics  . Smoking status: Current Every Day Smoker    Types: Cigarettes  . Smokeless tobacco: Not on file  . Alcohol Use: No    Review of Systems Constitutional: No fever/chills Eyes: No visual changes. ENT: No sore throat. Cardiovascular: Positive left lateral chest pain. Respiratory: Positive shortness of breath due to increased pain. Gastrointestinal: No abdominal pain.  No nausea, no vomiting.  No diarrhea.  No constipation. Genitourinary: Negative for dysuria. Musculoskeletal: Negative for back pain. Skin:  Negative for rash. Neurological: Negative for headaches, focal weakness or numbness.  10-point ROS otherwise negative.  ____________________________________________   PHYSICAL EXAM:  VITAL SIGNS: ED Triage Vitals  Enc Vitals Group     BP 11/20/14 0703 114/84 mmHg     Pulse Rate 11/20/14 0703 67     Resp 11/20/14 0703 18     Temp 11/20/14 0703 97.5 F (36.4 C)     Temp Source 11/20/14 0703 Oral     SpO2 11/20/14 0703 100 %     Weight 11/20/14 0703 135 lb (61.236 kg)     Height 11/20/14 0703 5\' 7"  (1.702 m)     Head Cir --      Peak Flow --      Pain Score 11/20/14 0704 9     Pain Loc --      Pain Edu? --      Excl. in GC? --     Constitutional: Alert and oriented. Well appearing and in no acute distress. Eyes: Conjunctivae are normal. PERRL. EOMI. Head: Atraumatic. Nose: No congestion/rhinnorhea. Mouth/Throat: Mucous membranes are moist. Neck: No stridor.   Cardiovascular: Normal rate, regular rhythm. Grossly normal heart sounds.  Good peripheral circulation. Respiratory: Guarded respiratory effort.  No retractions. Lungs CTAB. Patient is markedly tender left lateral rib approximately seventh and eighth rib. There is no deformity in the area. No swelling or ecchymosis noted. Gastrointestinal: Soft and nontender. No distention. No abdominal bruits. No CVA tenderness. Musculoskeletal: No lower  extremity tenderness nor edema.  No joint effusions. Neurologic:  Normal speech and language. No gross focal neurologic deficits are appreciated. No gait instability. Skin:  Skin is warm, dry and intact. No rash noted. See note on skin above. Psychiatric: Mood and affect are normal. Speech and behavior are normal.  ____________________________________________   LABS (all labs ordered are listed, but only abnormal results are displayed)  Labs Reviewed - No data to display ____________________________________________ RADIOLOGY X-ray left ribs and chest showed no fractures per  radiologist. Beaulah Corin, personally viewed and evaluated these images as part of my medical decision making.     PROCEDURES  Procedure(s) performed: None  Critical Care performed: No  ____________________________________________   INITIAL IMPRESSION / ASSESSMENT AND PLAN / ED COURSE  Pertinent labs & imaging results that were available during my care of the patient were reviewed by me and considered in my medical decision making (see chart for details   patient was given a prescription for Norco. She is aware that she can only take this at night or when she is at home. She is also encouraged to use ice to the area at present. She was given a note with restrictions for work.   FINAL CLINICAL IMPRESSION(S) / ED DIAGNOSES  Final diagnoses:  Rib contusion, left, initial encounter      Tommi Rumps, PA-C 11/20/14 1610  Arnaldo Natal, MD 11/20/14 1726

## 2015-01-02 ENCOUNTER — Ambulatory Visit: Payer: Self-pay

## 2015-01-16 ENCOUNTER — Ambulatory Visit: Payer: Self-pay

## 2015-01-23 ENCOUNTER — Institutional Professional Consult (permissible substitution): Payer: Self-pay | Admitting: Licensed Clinical Social Worker

## 2015-01-30 ENCOUNTER — Ambulatory Visit: Payer: Self-pay

## 2015-01-30 ENCOUNTER — Ambulatory Visit: Payer: Self-pay | Admitting: Licensed Clinical Social Worker

## 2015-02-05 ENCOUNTER — Other Ambulatory Visit: Payer: Self-pay | Admitting: Urology

## 2015-02-05 DIAGNOSIS — R05 Cough: Secondary | ICD-10-CM

## 2015-02-05 DIAGNOSIS — R059 Cough, unspecified: Secondary | ICD-10-CM

## 2015-03-07 ENCOUNTER — Ambulatory Visit: Payer: Self-pay

## 2015-03-28 ENCOUNTER — Ambulatory Visit: Payer: Self-pay

## 2015-05-01 ENCOUNTER — Other Ambulatory Visit: Payer: Self-pay

## 2015-05-01 LAB — BASIC METABOLIC PANEL
BUN: 12 mg/dL (ref 4–21)
Creatinine: 0.8 mg/dL (ref <=1.1)
GLUCOSE: 75 mg/dL
POTASSIUM: 4 mmol/L (ref 3.4–5.3)
SODIUM: 138 mmol/L (ref 137–147)

## 2015-05-08 ENCOUNTER — Ambulatory Visit: Payer: Self-pay

## 2015-05-08 DIAGNOSIS — N3281 Overactive bladder: Secondary | ICD-10-CM | POA: Insufficient documentation

## 2015-05-08 DIAGNOSIS — I10 Essential (primary) hypertension: Secondary | ICD-10-CM | POA: Insufficient documentation

## 2015-06-13 DIAGNOSIS — N3281 Overactive bladder: Secondary | ICD-10-CM

## 2015-06-13 DIAGNOSIS — I10 Essential (primary) hypertension: Secondary | ICD-10-CM

## 2015-07-08 ENCOUNTER — Ambulatory Visit: Payer: Self-pay

## 2015-07-15 ENCOUNTER — Ambulatory Visit: Payer: Self-pay | Admitting: Urology

## 2015-07-15 VITALS — BP 127/74 | HR 65 | Wt 143.0 lb

## 2015-07-15 DIAGNOSIS — K219 Gastro-esophageal reflux disease without esophagitis: Secondary | ICD-10-CM

## 2015-07-15 DIAGNOSIS — I10 Essential (primary) hypertension: Secondary | ICD-10-CM

## 2015-07-15 DIAGNOSIS — N3281 Overactive bladder: Secondary | ICD-10-CM

## 2015-07-15 MED ORDER — HYDROCHLOROTHIAZIDE 25 MG PO TABS
25.0000 mg | ORAL_TABLET | Freq: Every day | ORAL | Status: DC
Start: 2015-07-15 — End: 2017-07-26

## 2015-07-15 MED ORDER — OXYBUTYNIN CHLORIDE 5 MG PO TABS: 5.0000 mg | ORAL_TABLET | Freq: Three times a day (TID) | ORAL | Status: DC

## 2015-07-15 MED ORDER — RANITIDINE HCL 150 MG PO TABS: 150.0000 mg | ORAL_TABLET | Freq: Two times a day (BID) | ORAL | Status: DC

## 2015-07-15 NOTE — Progress Notes (Signed)
       Patient: Rebecca Graham Female    DOB: 06/26/1955   60 y.o.   MRN: [id number] Visit Date: 07/15/2015  Today's Provider: [facility]   Chief Complaint  Patient presents with  . lab results   Subjective:    Hypertension This is a chronic problem. The current episode started more than 1 year ago. The problem is unchanged. The problem is controlled. Pertinent negatives include no anxiety, blurred vision, chest pain, headaches, malaise/fatigue, neck pain, orthopnea, palpitations, peripheral edema, PND, shortness of breath or sweats. There are no associated agents to hypertension. Risk factors for coronary artery disease include smoking/tobacco exposure and post-menopausal state. Past treatments include diuretics. The current treatment provides significant improvement. Compliance problems include psychosocial issues.    Symptoms of GERD every day.    Allergies  Allergen Reactions  . Aspirin Rash  . Penicillins Rash   Previous Medications   ACETAMINOPHEN (TYLENOL) 500 MG TABLET    Take 500 mg by mouth every 6 (six) hours as needed.   HYDROCHLOROTHIAZIDE (HYDRODIURIL) 25 MG TABLET    Take 25 mg by mouth daily.   HYDROCODONE-ACETAMINOPHEN (NORCO/VICODIN) 5-325 MG PER TABLET    Take 1 tablet by mouth every 4 (four) hours as needed for moderate pain.   METHYLPREDNISOLONE (MEDROL DOSEPAK) 4 MG TBPK TABLET    Take as directed   OXYBUTYNIN (DITROPAN) 5 MG TABLET    Take 5 mg by mouth 3 (three) times daily. Reported on 07/15/2015    Review of Systems  Constitutional: Negative for malaise/fatigue.  Eyes: Negative for blurred vision.  Respiratory: Negative for shortness of breath.   Cardiovascular: Negative for chest pain, palpitations, orthopnea and PND.  Musculoskeletal: Negative for neck pain.  Neurological: Negative for headaches.    Social History  Substance Use Topics  . Smoking status: Current Every Day Smoker -- 1.00 packs/day for 45 years    Types: Cigarettes    . Smokeless tobacco: Never Used  . Alcohol Use: 1.2 oz/week    2 Cans of beer per week   Objective:   BP 127/74 mmHg  Pulse 65  Wt 143 lb (64.864 kg)  Physical Exam  Constitutional: She is oriented to person, place, and time. She appears well-developed and well-nourished.  HENT:  Head: Normocephalic and atraumatic.  Cardiovascular: Normal rate and regular rhythm.   Pulmonary/Chest: Effort normal and breath sounds normal.  Abdominal: Soft. Bowel sounds are normal.  Musculoskeletal: Normal range of motion.  Neurological: She is alert and oriented to person, place, and time. She has normal reflexes.  Skin: Skin is warm and dry.  Psychiatric: She has a normal mood and affect. Her behavior is normal. Judgment and thought content normal.        Assessment & Plan:     1. HTN:   Good control.  Continue HCTZ.  2. GERD:  Start Zantac 150 mg bid, follow up in 6 weeks  3. OAB:   Restart oxybutynin, follow up in 6 weeks      [facility]  [facility]

## 2015-08-28 ENCOUNTER — Ambulatory Visit: Payer: Self-pay

## 2016-06-25 IMAGING — CR DG CHEST 2V
1 series · 2 of 2 positions shown · non-contrast
Comparison: None.

CLINICAL DATA: Acute onset of chest pain, radiating to the back.
Cough. Initial encounter.

EXAM:
CHEST  2 VIEW

[Series 1: dg chest 2 view · 0.14mm/px · 2 of 2 slices shown]
[im 1/2]
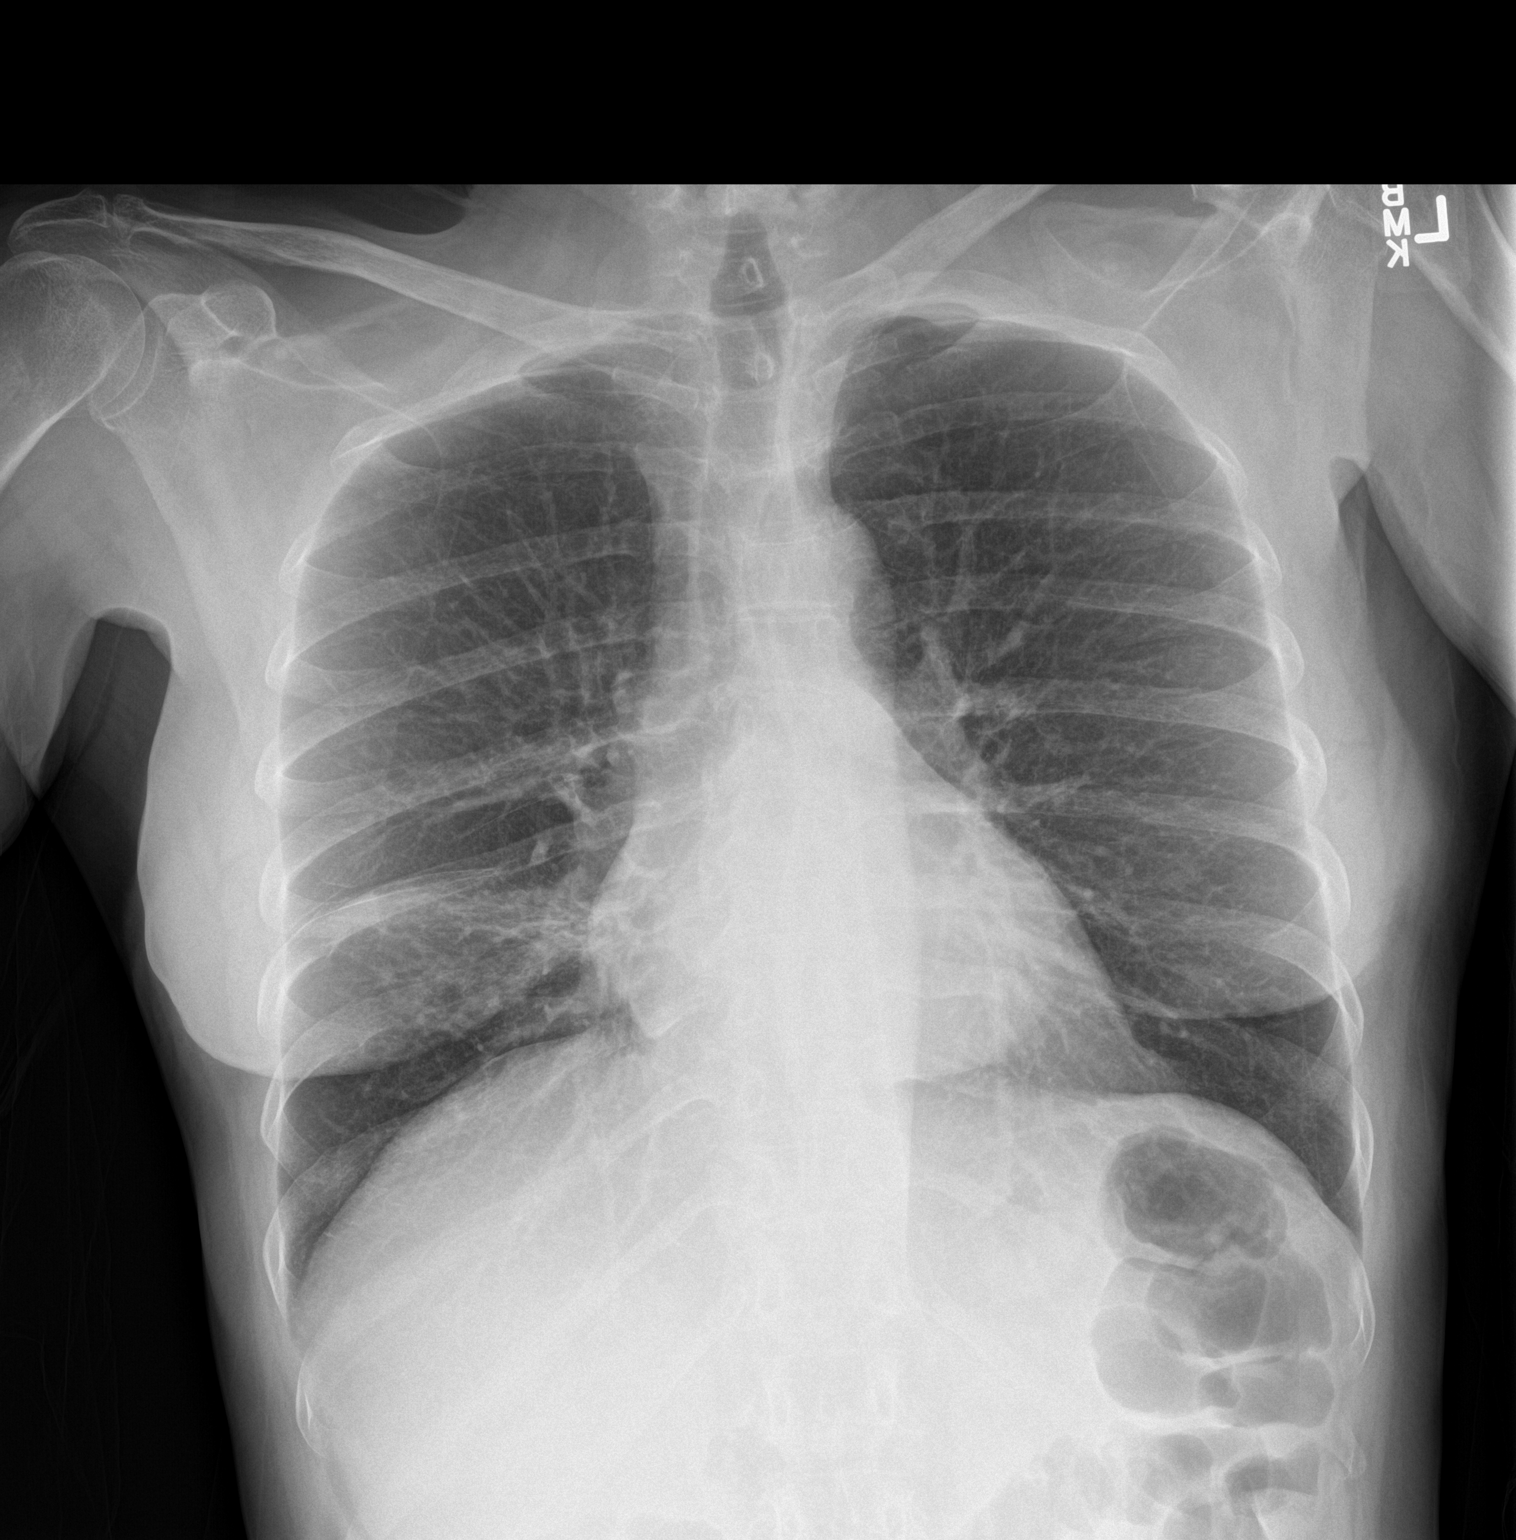
[im 2/2]
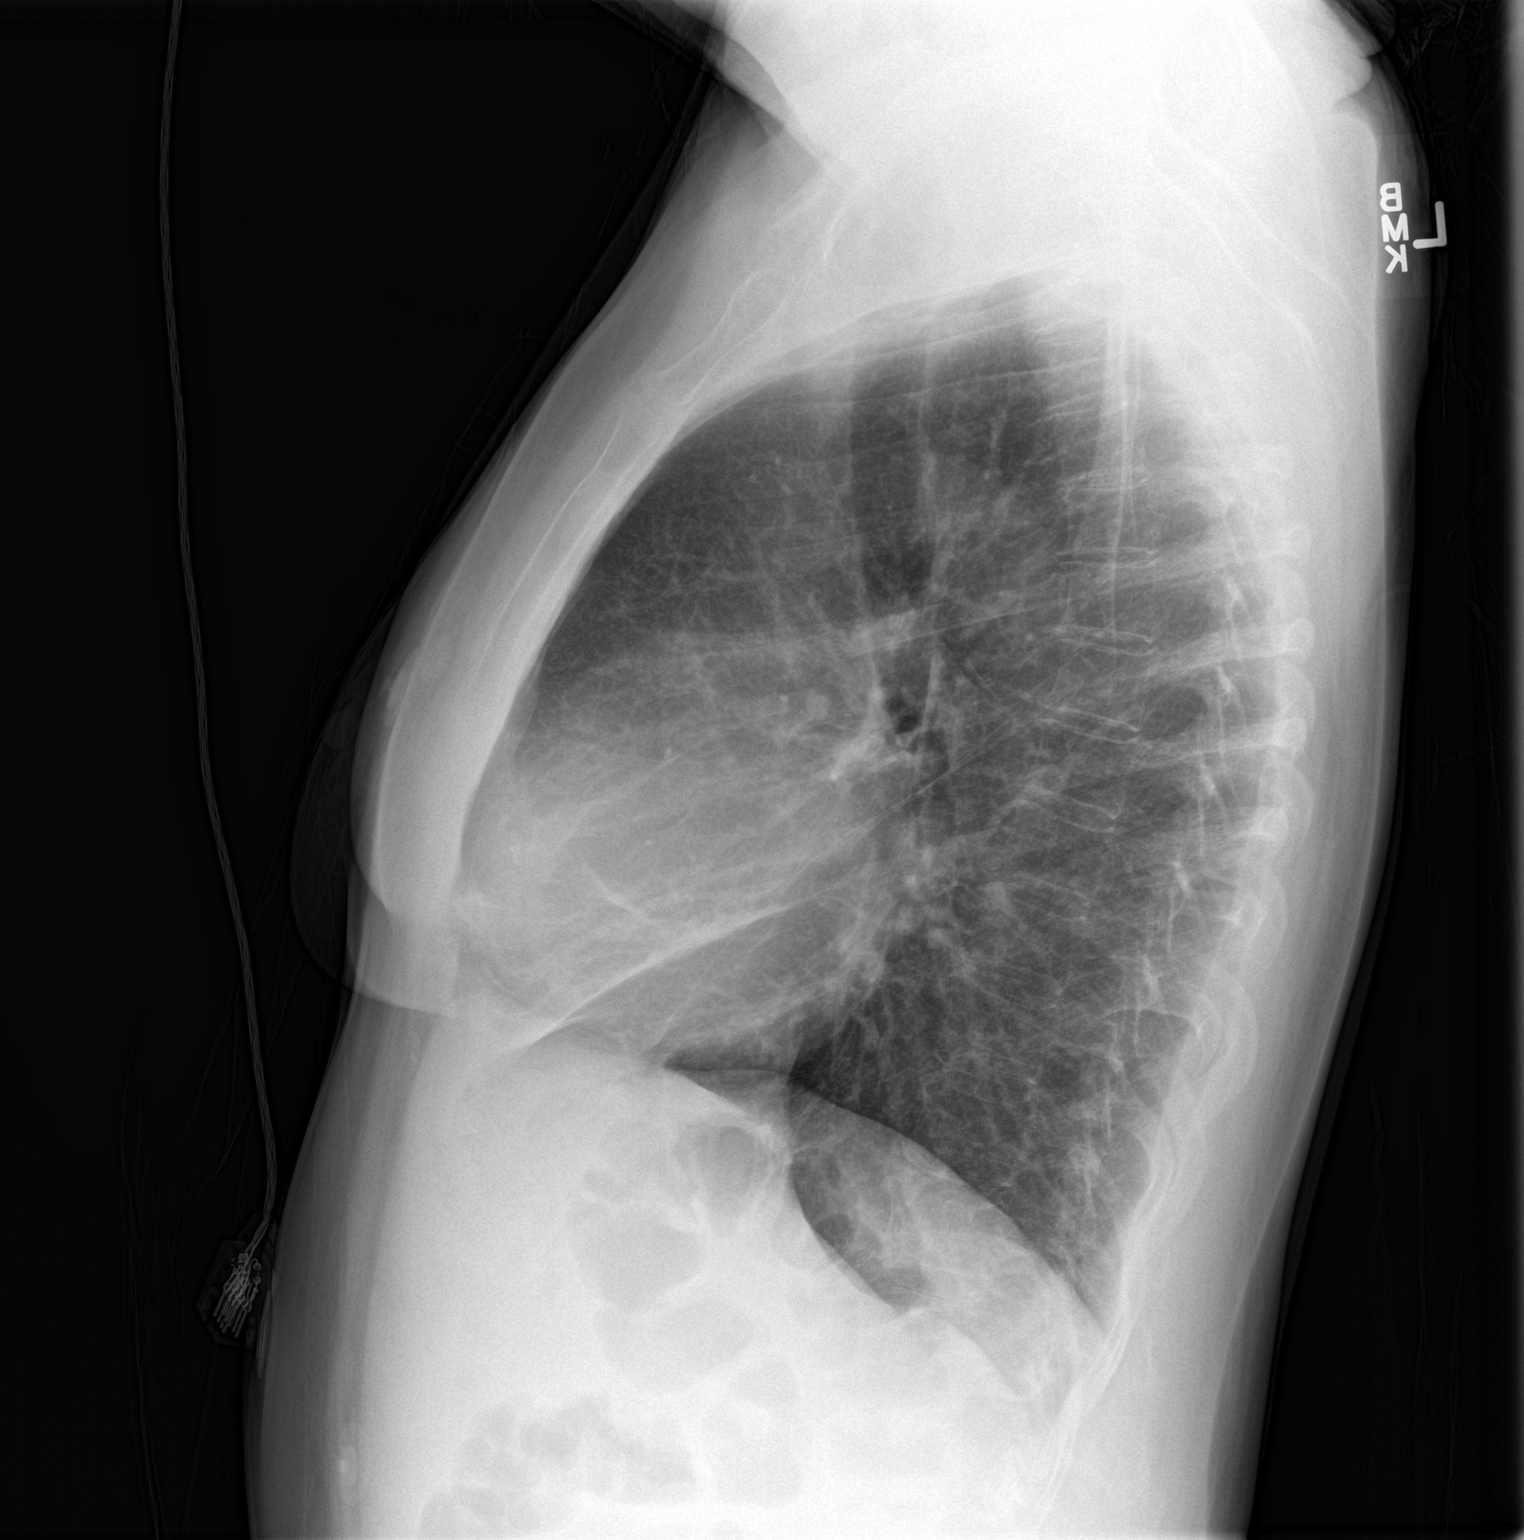

[2 of 2 positions shown; findings below may reference images not displayed]

FINDINGS: The lungs are well-aerated. Right midlung airspace opacity raises
concern for mild pneumonia. There is no evidence of pleural effusion
or pneumothorax.

The heart is normal in size; the mediastinal contour is within
normal limits. No acute osseous abnormalities are seen.
IMPRESSION: Right midlung airspace opacity raises concern for mild pneumonia.

## 2016-07-05 IMAGING — CR DG CHEST 2V
1 series · 2 of 2 positions shown · non-contrast
Comparison: None.

CLINICAL DATA: Pneumonia.

EXAM:
CHEST  2 VIEW

[Series 1: w chest pa · 0.14mm/px · 2 of 2 slices shown]
[im 1/2]
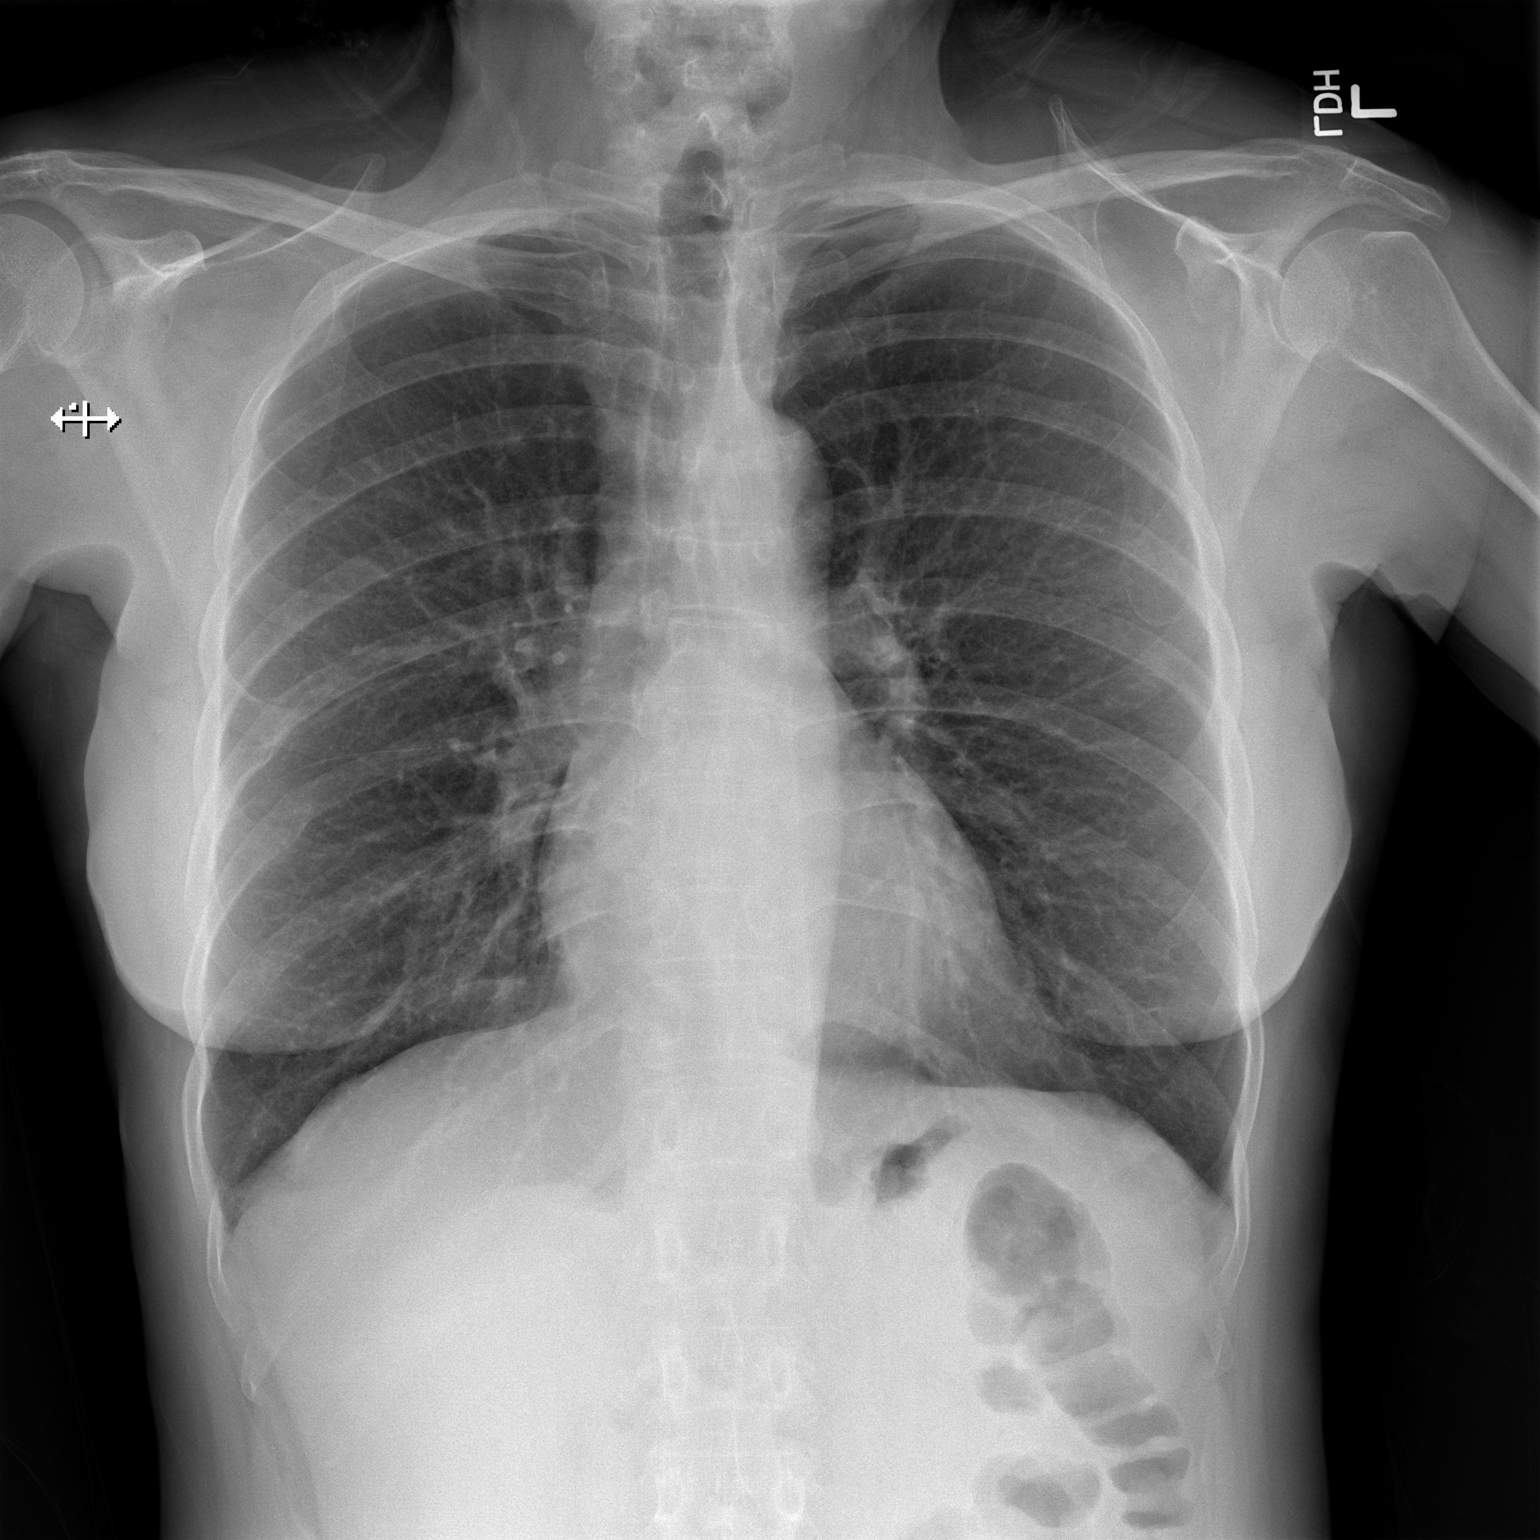
[im 2/2]
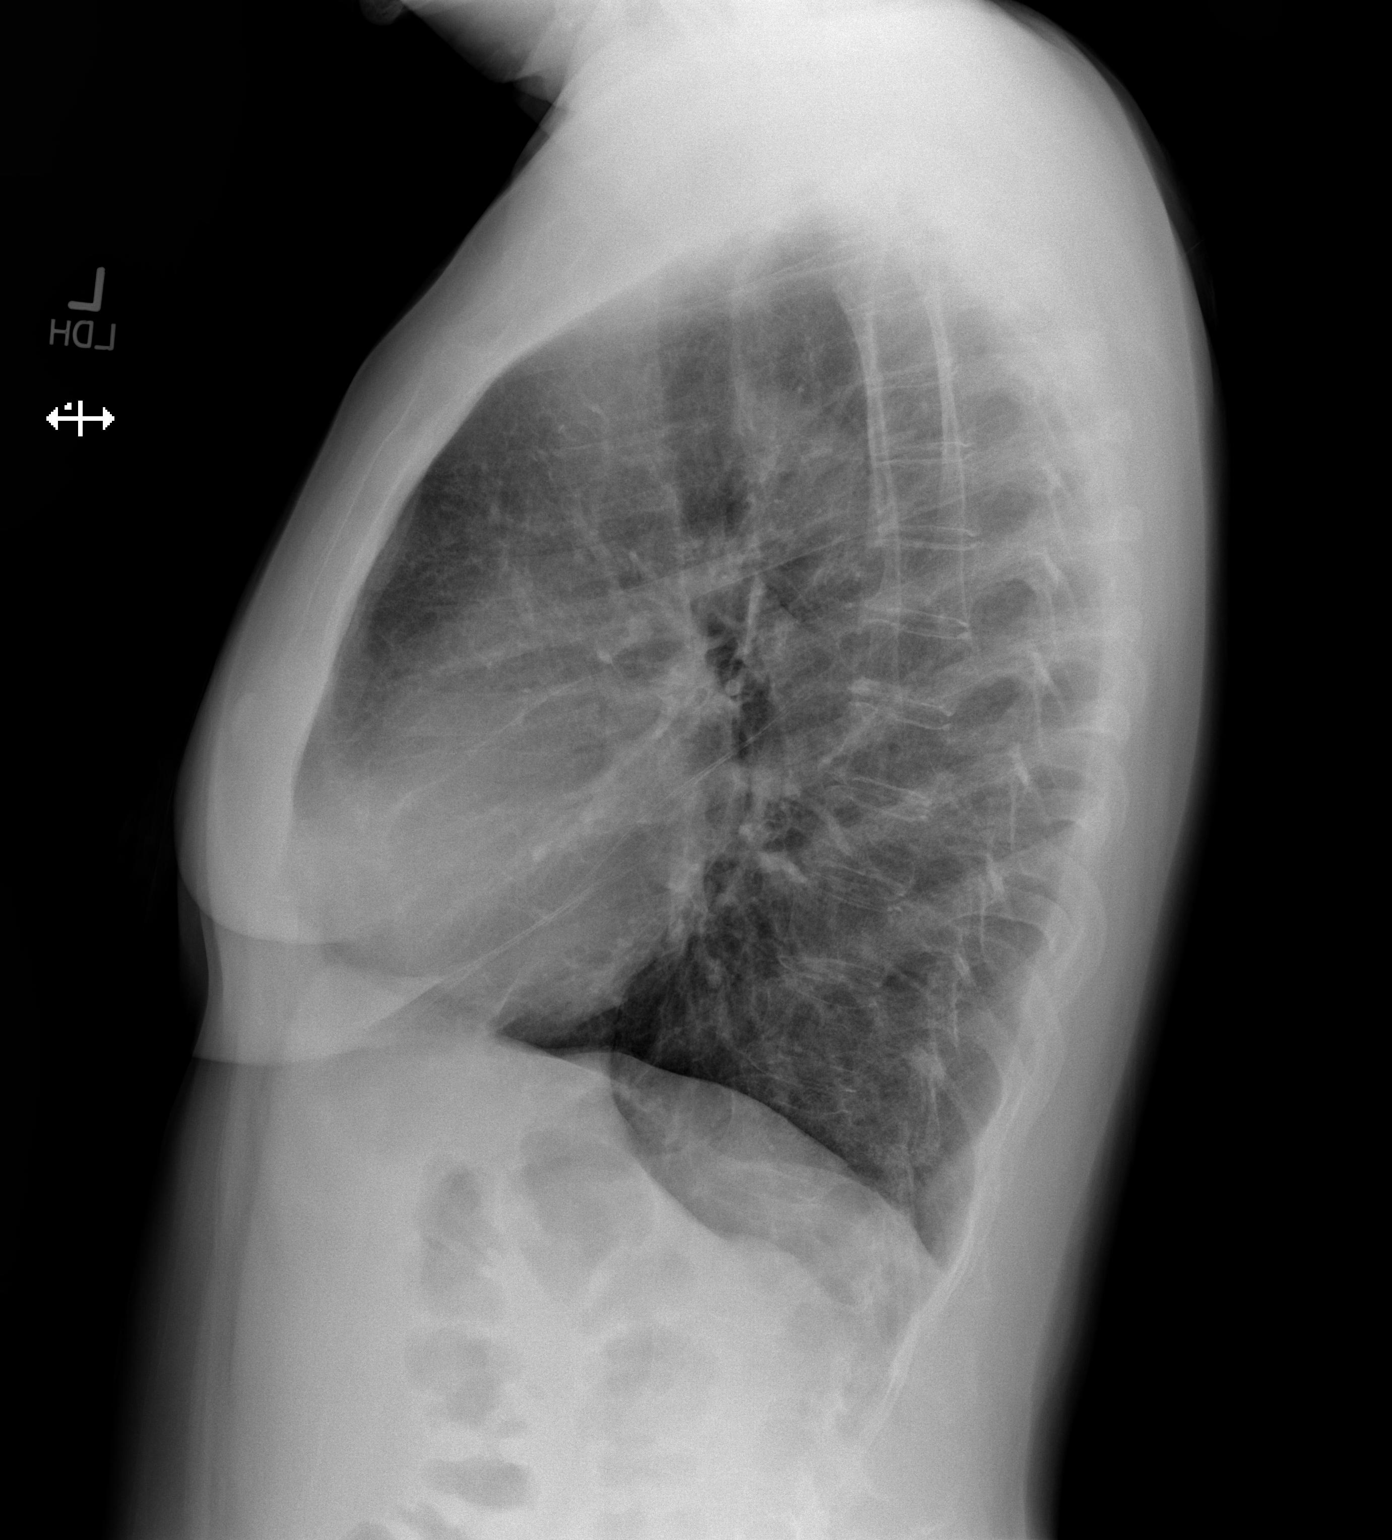

[2 of 2 positions shown; findings below may reference images not displayed]

FINDINGS: Mediastinum and hilar structures are normal. Interim near complete
clearing of right middle lobe pneumonia. No pleural effusion or
pneumothorax. Old right rib fractures rib fractures.
IMPRESSION: Interim near complete clearing of right middle lobe pneumonia.

## 2017-06-03 ENCOUNTER — Encounter: Payer: Self-pay | Admitting: *Deleted

## 2017-06-03 ENCOUNTER — Other Ambulatory Visit: Payer: Self-pay

## 2017-06-03 ENCOUNTER — Emergency Department
Admission: EM | Admit: 2017-06-03 | Discharge: 2017-06-03 | Disposition: A | Discharge status: Home / Self Care | Payer: PRIVATE HEALTH INSURANCE | Attending: Student in an Organized Health Care Education/Training Program | Admitting: Student in an Organized Health Care Education/Training Program

## 2017-06-03 DIAGNOSIS — Z79899 Other long term (current) drug therapy: Secondary | ICD-10-CM | POA: Insufficient documentation

## 2017-06-03 DIAGNOSIS — L03115 Cellulitis of right lower limb: Secondary | ICD-10-CM

## 2017-06-03 DIAGNOSIS — L2389 Allergic contact dermatitis due to other agents: Secondary | ICD-10-CM

## 2017-06-03 DIAGNOSIS — I10 Essential (primary) hypertension: Secondary | ICD-10-CM | POA: Insufficient documentation

## 2017-06-03 DIAGNOSIS — F17210 Nicotine dependence, cigarettes, uncomplicated: Secondary | ICD-10-CM | POA: Insufficient documentation

## 2017-06-03 MED ORDER — PREDNISONE 10 MG (21) PO TBPK: ORAL_TABLET | ORAL | 0 refills | Status: DC

## 2017-06-03 MED ORDER — HYDROXYZINE HCL 25 MG PO TABS: 25.0000 mg | ORAL_TABLET | Freq: Three times a day (TID) | ORAL | 0 refills | Status: DC | PRN

## 2017-06-03 MED ORDER — SULFAMETHOXAZOLE-TRIMETHOPRIM 800-160 MG PO TABS: 1.0000 | ORAL_TABLET | Freq: Two times a day (BID) | ORAL | 0 refills | Status: DC

## 2017-06-03 NOTE — ED Notes (Signed)

## 2017-06-03 NOTE — ED Provider Notes (Signed)
[facility] Emergency Department Provider Note  ____________________________________________  Time seen: Approximately 7:28 PM  I have reviewed the triage vital signs and the nursing notes.   HISTORY  Chief Complaint Rash   HPI Rebecca Graham is a 62 y.o. female who presents to the emergency department for evaluation and treatment of a wound to the right lower extremity that is been present since sometime in December.  She states that she scraped her leg on her car and since that time has been treating this wound with antibacterial ointment.  She states that it just has not gotten any better and the area of redness is now growing in size.  She states that she has noted some purulent drainage occasionally from the site.  She denies fever.  Patient also complains of a pruritic rash that started approximately 3 weeks ago.  She has retraced it back to a change in her laundry detergent.  She has not taken any anti-itch medications or applied any creams or ointments to the area.  Past Medical History:  Diagnosis Date  . GERD (gastroesophageal reflux disease)   . Pneumonia     Patient Active Problem List   Diagnosis Date Noted  . Essential hypertension 05/08/2015  . OAB (overactive bladder) 05/08/2015    Past Surgical History:  Procedure Laterality Date  . TUBAL LIGATION  1987    Prior to Admission medications   Medication Sig Start Date End Date Taking? Authorizing Provider  acetaminophen (TYLENOL) 500 MG tablet Take 500 mg by mouth every 6 (six) hours as needed.    [provider]  hydrochlorothiazide (HYDRODIURIL) 25 MG tablet Take 1 tablet (25 mg total) by mouth daily. 07/15/15   Michiel CowboyMcGowan, Shannon A, PA-C  HYDROcodone-acetaminophen (NORCO/VICODIN) 5-325 MG per tablet Take 1 tablet by mouth every 4 (four) hours as needed for moderate pain. 11/20/14   Tommi RumpsSummers, Rhonda L, PA-C  hydrOXYzine (ATARAX/VISTARIL) 25 MG tablet Take 1 tablet (25 mg total) by  mouth 3 (three) times daily as needed. 06/03/17   Egbert Seidel, Rulon Eisenmengerari B, FNP  oxybutynin (DITROPAN) 5 MG tablet Take 1 tablet (5 mg total) by mouth 3 (three) times daily. Reported on 07/15/2015 07/15/15   Michiel CowboyMcGowan, Shannon A, PA-C  predniSONE (STERAPRED UNI-PAK 21 TAB) 10 MG (21) TBPK tablet Take 6 tablets on day 1 Take 5 tablets on day 2 Take 4 tablets on day 3 Take 3 tablets on day 4 Take 2 tablets on day 5 Take 1 tablet on day 6 06/03/17   Elecia Serafin B, FNP  ranitidine (ZANTAC) 150 MG tablet Take 1 tablet (150 mg total) by mouth 2 (two) times daily. 07/15/15   Michiel CowboyMcGowan, Shannon A, PA-C  sulfamethoxazole-trimethoprim (BACTRIM DS,SEPTRA DS) 800-160 MG tablet Take 1 tablet by mouth 2 (two) times daily. 06/03/17   Rossanna Spitzley, Rulon Eisenmengerari B, FNP    Allergies Aspirin and Penicillins  Family History  Problem Relation Age of Onset  . Multiple sclerosis Mother   . Colon cancer Father   . Lung cancer Sister     Social History Social History   Tobacco Use  . Smoking status: Current Every Day Smoker    Packs/day: 1.00    Years: 45.00    Pack years: 45.00    Types: Cigarettes  . Smokeless tobacco: Never Used  Substance Use Topics  . Alcohol use: Yes    Alcohol/week: 1.2 oz    Types: 2 Cans of beer per week  . Drug use: No    Review of  Systems  Constitutional: Negative for fever. Respiratory: Negative for cough or shortness of breath.  Musculoskeletal: Negative for myalgias Skin: Positive for wound to the pretibial area of the right lower extremity.  Positive for diffuse, pruritic rash over the extremities and trunk Neurological: Negative for numbness or paresthesias. ____________________________________________   PHYSICAL EXAM:  VITAL SIGNS: ED Triage Vitals  Enc Vitals Group     BP 06/03/17 1816 114/86     Pulse Rate 06/03/17 1816 93     Resp 06/03/17 1816 20     Temp 06/03/17 1816 98.3 F (36.8 C)     Temp Source 06/03/17 1816 Oral     SpO2 06/03/17 1816 97 %     Weight 06/03/17  1817 145 lb (65.8 kg)     Height 06/03/17 1817 5\' 6"  (1.676 m)     Head Circumference --      Peak Flow --      Pain Score 06/03/17 1817 10     Pain Loc --      Pain Edu? --      Excl. in GC? --      Constitutional: Well appearing. Eyes: Conjunctivae are clear without discharge or drainage. Nose: No rhinorrhea noted. Mouth/Throat: Airway is patent.  Neck: No stridor. Unrestricted range of motion observed.  Cardiovascular: Capillary refill is <3 seconds.  Respiratory: Respirations are even and unlabored.. Musculoskeletal: Unrestricted range of motion observed. Neurologic: Awake, alert, and oriented x 4.  Skin: Approximately 10 cm area of abrasion to the pretibial area on the right lower extremity with surrounding erythema is noted.  No purulent drainage or fluctuant area is noted.  Diffuse, maculopapular, erythematous lesions are noted over the trunk, neck, upper and lower extremities.   ____________________________________________   LABS (all labs ordered are listed, but only abnormal results are displayed)  Labs Reviewed - No data to display ____________________________________________  EKG  Not indicated. ____________________________________________  RADIOLOGY  Not indicated. ____________________________________________   PROCEDURES  Procedures ____________________________________________   INITIAL IMPRESSION / ASSESSMENT AND PLAN / ED COURSE  [name] is a 62 y.o. female who presents to the emergency department for evaluation and treatment of rash and wound to the right lower extremity. She will be given a prescription for Bactrim, Atarax, and Prednisone. She is to follow up with the primary care provider if not improving over the next few days. She is to return to the ER for symptoms that change or worsen if unable to schedule an appointment.  Medications - No data to display   Pertinent labs & imaging results that were available during my care of  the patient were reviewed by me and considered in my medical decision making (see chart for details). ____________________________________________   FINAL CLINICAL IMPRESSION(S) / ED DIAGNOSES  Final diagnoses:  Allergic contact dermatitis due to other agents  Cellulitis of right lower extremity    ED Discharge Orders        Ordered    sulfamethoxazole-trimethoprim (BACTRIM DS,SEPTRA DS) 800-160 MG tablet  2 times daily     06/03/17 1924    predniSONE (STERAPRED UNI-PAK 21 TAB) 10 MG (21) TBPK tablet     06/03/17 1924    hydrOXYzine (ATARAX/VISTARIL) 25 MG tablet  3 times daily PRN     06/03/17 1924       Note:  This document was prepared using Dragon voice recognition software and may include unintentional dictation errors.    Chinita Pester, FNP 06/03/17 1938    Roxan Hockey,  Luisa Hart, MD 06/03/17 [phone]

## 2017-06-03 NOTE — ED Triage Notes (Signed)
Pt has rash on arms, chest, back for 2 weeks.  Pt also has an abrasion to right lower leg for 1 month.  Pt states not feeling any better. Pt taking tylenol cold without relief.

## 2017-06-07 ENCOUNTER — Encounter: Payer: Self-pay | Admitting: Emergency Medicine

## 2017-06-07 ENCOUNTER — Emergency Department: Payer: PRIVATE HEALTH INSURANCE

## 2017-06-07 ENCOUNTER — Emergency Department
Admission: EM | Admit: 2017-06-07 | Discharge: 2017-06-07 | Disposition: A | Discharge status: Home / Self Care | Payer: PRIVATE HEALTH INSURANCE | Attending: Emergency Medicine | Admitting: Emergency Medicine

## 2017-06-07 DIAGNOSIS — Y92018 Other place in single-family (private) house as the place of occurrence of the external cause: Secondary | ICD-10-CM | POA: Insufficient documentation

## 2017-06-07 DIAGNOSIS — F17210 Nicotine dependence, cigarettes, uncomplicated: Secondary | ICD-10-CM | POA: Insufficient documentation

## 2017-06-07 DIAGNOSIS — Y9383 Activity, rough housing and horseplay: Secondary | ICD-10-CM | POA: Insufficient documentation

## 2017-06-07 DIAGNOSIS — Z79899 Other long term (current) drug therapy: Secondary | ICD-10-CM | POA: Insufficient documentation

## 2017-06-07 DIAGNOSIS — Y999 Unspecified external cause status: Secondary | ICD-10-CM | POA: Insufficient documentation

## 2017-06-07 DIAGNOSIS — W228XXA Striking against or struck by other objects, initial encounter: Secondary | ICD-10-CM | POA: Insufficient documentation

## 2017-06-07 DIAGNOSIS — I10 Essential (primary) hypertension: Secondary | ICD-10-CM | POA: Insufficient documentation

## 2017-06-07 DIAGNOSIS — S52224A Nondisplaced transverse fracture of shaft of right ulna, initial encounter for closed fracture: Secondary | ICD-10-CM | POA: Insufficient documentation

## 2017-06-07 MED ORDER — OXYCODONE-ACETAMINOPHEN 5-325 MG PO TABS: 1.0000 | ORAL_TABLET | Freq: Four times a day (QID) | ORAL | 0 refills | Status: DC | PRN

## 2017-06-07 MED ORDER — OXYCODONE-ACETAMINOPHEN 5-325 MG PO TABS
1.0000 | ORAL_TABLET | Freq: Once | ORAL | Status: AC
Start: 2017-06-07 — End: 2017-06-07
  Administered 2017-06-07: 1 via ORAL
  Filled 2017-06-07: qty 1

## 2017-06-07 NOTE — ED Provider Notes (Signed)
[facility] Emergency Department Provider Note  ____________________________________________  Time seen: Approximately 7:15 PM  I have reviewed the triage vital signs and the nursing notes.   HISTORY  Chief Complaint Arm Pain    HPI Rebecca Graham is a 62 y.o. female who presents the emergency department complaining of sharp right forearm pain.  Patient reports that she was horsing around with some of her kids, so on her arm downwards and came in contact with the back of hard back chair.  Patient reports that this happened on the pinky side.  She is experiencing sharp pain, swelling to the area.  Limited range of motion to the right arm due to pain.  Patient denies any other complaints.  She has not tried any medication prior to arrival.  Patient is unable to take NSAID medications due to bleeding stomach ulcers.  Past Medical History:  Diagnosis Date  . GERD (gastroesophageal reflux disease)   . Pneumonia     Patient Active Problem List   Diagnosis Date Noted  . Essential hypertension 05/08/2015  . OAB (overactive bladder) 05/08/2015    Past Surgical History:  Procedure Laterality Date  . TUBAL LIGATION  1987    Prior to Admission medications   Medication Sig Start Date End Date Taking? Authorizing Provider  acetaminophen (TYLENOL) 500 MG tablet Take 500 mg by mouth every 6 (six) hours as needed.    [provider]  hydrochlorothiazide (HYDRODIURIL) 25 MG tablet Take 1 tablet (25 mg total) by mouth daily. 07/15/15   Michiel Cowboy A, PA-C  HYDROcodone-acetaminophen (NORCO/VICODIN) 5-325 MG per tablet Take 1 tablet by mouth every 4 (four) hours as needed for moderate pain. 11/20/14   Tommi Rumps, PA-C  hydrOXYzine (ATARAX/VISTARIL) 25 MG tablet Take 1 tablet (25 mg total) by mouth 3 (three) times daily as needed. 06/03/17   Triplett, Rulon Eisenmenger B, FNP  oxybutynin (DITROPAN) 5 MG tablet Take 1 tablet (5 mg total) by mouth 3 (three) times  daily. Reported on 07/15/2015 07/15/15   Michiel Cowboy A, PA-C  oxyCODONE-acetaminophen (PERCOCET/ROXICET) 5-325 MG tablet Take 1 tablet by mouth every 6 (six) hours as needed for severe pain. 06/07/17   Cuthriell, Delorise Royals, PA-C  predniSONE (STERAPRED UNI-PAK 21 TAB) 10 MG (21) TBPK tablet Take 6 tablets on day 1 Take 5 tablets on day 2 Take 4 tablets on day 3 Take 3 tablets on day 4 Take 2 tablets on day 5 Take 1 tablet on day 6 06/03/17   Triplett, Cari B, FNP  ranitidine (ZANTAC) 150 MG tablet Take 1 tablet (150 mg total) by mouth 2 (two) times daily. 07/15/15   Michiel Cowboy A, PA-C  sulfamethoxazole-trimethoprim (BACTRIM DS,SEPTRA DS) 800-160 MG tablet Take 1 tablet by mouth 2 (two) times daily. 06/03/17   Triplett, Rulon Eisenmenger B, FNP    Allergies Aspirin and Penicillins  Family History  Problem Relation Age of Onset  . Multiple sclerosis Mother   . Colon cancer Father   . Lung cancer Sister     Social History Social History   Tobacco Use  . Smoking status: Current Every Day Smoker    Packs/day: 1.00    Years: 45.00    Pack years: 45.00    Types: Cigarettes  . Smokeless tobacco: Never Used  Substance Use Topics  . Alcohol use: Yes    Alcohol/week: 1.2 oz    Types: 2 Cans of beer per week  . Drug use: No     Review of Systems  Constitutional: No fever/chills Eyes: No visual changes.  Cardiovascular: no chest pain. Respiratory: no cough. No SOB. Gastrointestinal: No abdominal pain.  No nausea, no vomiting.   Musculoskeletal: Positive for right forearm pain. Skin: Negative for rash, abrasions, lacerations, ecchymosis. Neurological: Negative for headaches, focal weakness or numbness. 10-point ROS otherwise negative.  ____________________________________________   PHYSICAL EXAM:  VITAL SIGNS: ED Triage Vitals  Enc Vitals Group     BP 06/07/17 1800 (!) 143/78     Pulse Rate 06/07/17 1800 71     Resp 06/07/17 1800 18     Temp 06/07/17 1800 98.2 F (36.8 C)      Temp Source 06/07/17 1800 Oral     SpO2 06/07/17 1800 100 %     Weight 06/07/17 1800 145 lb (65.8 kg)     Height 06/07/17 1800 5\' 6"  (1.676 m)     Head Circumference --      Peak Flow --      Pain Score 06/07/17 1804 8     Pain Loc --      Pain Edu? --      Excl. in GC? --      Constitutional: Alert and oriented. Well appearing and in no acute distress. Eyes: Conjunctivae are normal. PERRL. EOMI. Head: Atraumatic. Neck: No stridor.    Cardiovascular: Normal rate, regular rhythm. Normal S1 and S2.  Good peripheral circulation. Respiratory: Normal respiratory effort without tachypnea or retractions. Lungs CTAB. Good air entry to the bases with no decreased or absent breath sounds. Musculoskeletal: Full range of motion to all extremities. No gross deformities appreciated.  On visualization, significant edema and deformity appreciated to the right forearm.  Deformity is noted on the medial aspect of the forearm.  Patient is limited range of motion to the right upper extremity due to pain.  Patient is able to flex and extend her wrist as well as flex and extend her elbow.  Full range of motion all 5 digits right hand.  Sensation intact all 5 digits.  Palpation along the radius and ulna reveals exquisite tenderness to palpation of the shaft of the ulna and moderate to significant tenderness over the shaft of the radius.  Soft tissue edema is appreciated but no palpable deformity of the osseous structures.  Radial pulse intact.  Sensation intact all 5 digits. Neurologic:  Normal speech and language. No gross focal neurologic deficits are appreciated.  Skin:  Skin is warm, dry and intact. No rash noted. Psychiatric: Mood and affect are normal. Speech and behavior are normal. Patient exhibits appropriate insight and judgement.   ____________________________________________   LABS (all labs ordered are listed, but only abnormal results are displayed)  Labs Reviewed - No data to  display ____________________________________________  EKG   ____________________________________________  RADIOLOGY Festus BarrenI, Jonathan D Cuthriell, personally viewed and evaluated these images (plain radiographs) as part of my medical decision making, as well as reviewing the written report by the radiologist.  I concur with radiologist finding of fracture of the distal ulna shaft.  Dg Forearm Right  Result Date: 06/07/2017 CLINICAL DATA:  Status post trauma to the forearm today. EXAM: RIGHT FOREARM - 2 VIEW COMPARISON:  None. FINDINGS: There is mild displaced fracture of the distal ulna shaft. There is no dislocation. IMPRESSION: Fracture of distal ulna shaft. Electronically Signed   By: Sherian ReinWei-Chen  Lin M.D.   On: 06/07/2017 19:05    ____________________________________________    PROCEDURES  Procedure(s) performed:    .Splint Application Date/Time: 06/07/2017 7:28 PM Performed  by: Racheal Patches, PA-C Authorized by: Racheal Patches, PA-C   Consent:    Consent obtained:  Verbal   Consent given by:  Patient   Risks discussed:  Pain and swelling Pre-procedure details:    Sensation:  Normal Procedure details:    Laterality:  Right   Location:  Arm   Arm:  R lower arm   Splint type:  Sugar tong   Supplies:  Cotton padding, Ortho-Glass, elastic bandage and sling Post-procedure details:    Pain:  Improved   Sensation:  Normal   Patient tolerance of procedure:  Tolerated well, no immediate complications      Medications  oxyCODONE-acetaminophen (PERCOCET/ROXICET) 5-325 MG per tablet 1 tablet (1 tablet Oral Given 06/07/17 1829)     ____________________________________________   INITIAL IMPRESSION / ASSESSMENT AND PLAN / ED COURSE  Pertinent labs & imaging results that were available during my care of the patient were reviewed by me and considered in my medical decision making (see chart for details).  Review of the [place] CSRS was performed in accordance of the  [facility] prior to dispensing any controlled drugs.     Patient's diagnosis is consistent with ulnar fracture of the distal shaft.  Initial differential included contusion versus fracture.  X-ray reveals fracture to the shaft of the ulna.  No significant displacement warranting urgent referral.  Patient's forearm is splinted as described above.  Patient tolerated well.  Splint care instructions are provided to patient.  She will follow-up with orthopedics for further management of his condition.. Patient will be discharged home with prescriptions for Percocet for pain. Patient is to follow up with [facility] as needed or otherwise directed. Patient is given ED precautions to return to the ED for any worsening or new symptoms.     ____________________________________________  FINAL CLINICAL IMPRESSION(S) / ED DIAGNOSES  Final diagnoses:  Closed nondisplaced transverse fracture of shaft of right ulna, initial encounter      NEW MEDICATIONS STARTED DURING THIS VISIT:  ED Discharge Orders        Ordered    oxyCODONE-acetaminophen (PERCOCET/ROXICET) 5-325 MG tablet  Every 6 hours PRN     06/07/17 1929          This chart was dictated using voice recognition software/Dragon. Despite best efforts to proofread, errors can occur which can change the meaning. Any change was purely unintentional.    Racheal Patches, PA-C 06/07/17 1929    Jeanmarie Plant, MD 06/07/17 [id number]

## 2017-06-07 NOTE — ED Triage Notes (Signed)
Pt with right arm pain, hit her arm on the chair.

## 2017-07-26 ENCOUNTER — Ambulatory Visit (INDEPENDENT_AMBULATORY_CARE_PROVIDER_SITE_OTHER): Payer: Self-pay | Admitting: Physician Assistant

## 2017-07-26 ENCOUNTER — Encounter: Payer: Self-pay | Admitting: Physician Assistant

## 2017-07-26 VITALS — BP 126/78 | HR 76 | Temp 98.4°F | Resp 16 | Ht 66.0 in | Wt 147.0 lb

## 2017-07-26 DIAGNOSIS — F1010 Alcohol abuse, uncomplicated: Secondary | ICD-10-CM

## 2017-07-26 DIAGNOSIS — Z599 Problem related to housing and economic circumstances, unspecified: Secondary | ICD-10-CM

## 2017-07-26 DIAGNOSIS — R21 Rash and other nonspecific skin eruption: Secondary | ICD-10-CM

## 2017-07-26 DIAGNOSIS — Z598 Other problems related to housing and economic circumstances: Secondary | ICD-10-CM

## 2017-07-26 MED ORDER — TRIAMCINOLONE ACETONIDE 0.1 % EX CREA: 1.0000 "application " | TOPICAL_CREAM | Freq: Two times a day (BID) | CUTANEOUS | 0 refills | Status: DC

## 2017-07-26 NOTE — Progress Notes (Signed)
Patient: Rebecca Graham Female    DOB: 07/04/1955   62 y.o.   MRN: [id number] Visit Date: 07/26/2017  Today's Provider: Trey SailorsAdriana M Apurva Reily, PA-C   Chief Complaint  Patient presents with  . Establish Care  . Rash    Started in the beginning of Feb  . Hypertension   Subjective:    Rebecca Graham is a 62 y/o new patient presenting today to establish care. She has a history of alcohol abuse and drinks 12 cans of beer per day, according to her daughter who presents with her today. Daughter also reports she does not eat much.  Patient is here with main concern of rash. She was seen for this two months ago in the ER. She reports a red area on her leg which she relates to a scratch. She also thought she may have used new laundry detergent. She was treated with prednisone, bactrim, hydroxyzine without improvement. Since then, the rash has spread to her other leg and arms. It is extremely itchy. Nobody else around her has this rash. She does not use IV drugs. She denies confusion and diarrhea.  Rash  This is a new problem. The rash is diffuse (Started from a "leg scrap" on her right leg. ). She was exposed to nothing. Pertinent negatives include no anorexia, congestion, cough, diarrhea, eye pain, facial edema, fatigue, fever, joint pain, nail changes, rhinorrhea, shortness of breath, sore throat or vomiting. Past treatments include oral steroids, antibiotics, anti-itch cream and antihistamine. The treatment provided no relief.       Allergies  Allergen Reactions  . Aspirin Rash  . Penicillins Rash     Current Outpatient Medications:  .  acetaminophen (TYLENOL) 500 MG tablet, Take 500 mg by mouth every 6 (six) hours as needed., Disp: , Rfl:  .  sulfamethoxazole-trimethoprim (BACTRIM DS,SEPTRA DS) 800-160 MG tablet, Take 1 tablet by mouth 2 (two) times daily. (Patient not taking: Reported on 07/26/2017), Disp: 20 tablet, Rfl: 0  Review of Systems  Constitutional: Negative for fatigue  and fever.  HENT: Negative for congestion, rhinorrhea and sore throat.   Eyes: Negative for pain.  Respiratory: Negative for cough and shortness of breath.   Gastrointestinal: Negative for anorexia, diarrhea and vomiting.  Musculoskeletal: Negative for joint pain.  Skin: Positive for rash. Negative for nail changes.    Social History   Tobacco Use  . Smoking status: Current Every Day Smoker    Packs/day: 1.00    Years: 45.00    Pack years: 45.00    Types: Cigarettes  . Smokeless tobacco: Never Used  Substance Use Topics  . Alcohol use: Yes    Alcohol/week: 7.2 oz    Types: 12 Cans of beer per week   Objective:   BP 126/78 (BP Location: Right Arm, Patient Position: Sitting, Cuff Size: Normal)   Pulse 76   Temp 98.4 F (36.9 C) (Oral)   Resp 16   Ht 5\' 6"  (1.676 m)   Wt 147 lb (66.7 kg)   BMI 23.73 kg/m  Vitals:   07/26/17 1425  BP: 126/78  Pulse: 76  Resp: 16  Temp: 98.4 F (36.9 C)  TempSrc: Oral  Weight: 147 lb (66.7 kg)  Height: 5\' 6"  (1.676 m)     Physical Exam  Constitutional: She is oriented to person, place, and time. She appears well-developed and well-nourished.  HENT:  Mouth/Throat: Oropharynx is clear and moist.  Cardiovascular: Normal rate and regular rhythm.  Pulmonary/Chest:  Effort normal and breath sounds normal.  Neurological: She is alert and oriented to person, place, and time.  Skin: Skin is warm and dry. Rash noted.  Multiple scaling, hyperpigmented plaques on anterior legs and arms. Please see pictures below.   Psychiatric: She has a normal mood and affect. Her behavior is normal.      Media Information         Document Information   Photos  Left arm   07/26/2017 14:41  Attached To:  Office Visit on 07/26/17 with Trey Sailors, PA-C   Source Information   Maryella Shivers  [facility]     Media Information         Document Information   Photos  Left arm  07/26/2017 14:41  Attached  To:  Office Visit on 07/26/17 with Trey Sailors, PA-C   Source Information   Maryella Shivers  [facility]     Media Information         Document Information   Photos  Left leg  07/26/2017 14:41  Attached To:  Office Visit on 07/26/17 with Trey Sailors, PA-C   Source Information   Maryella Shivers  [facility]      Media Information         Document Information   Photos  Right leg   07/26/2017 14:41  Attached To:  Office Visit on 07/26/17 with Trey Sailors, PA-C   Source Information   Maryella Shivers  [facility]                   Assessment & Plan:     1. Financial difficulties  Needs to get back in to see [facility] which she has seen in the past.   - Ambulatory referral to [facility]  2. Rash  I am concerned due to her heavy alcohol use and poor nutritions status for pellagra. I have instructed her to take a daily B-complex over the counter and to reduce alcohol intake. Possibly atypical presentation of psoriasis, however distribution is very atypical and her symptoms were not helped by steroids. I think pellagra is the most life threatening issue for which she can be treated and she should follow up if not improving.   - triamcinolone cream (KENALOG) 0.1 %; Apply 1 application topically 2 (two) times daily.  Dispense: 30 g; Refill: 0  3. Alcohol abuse  Drinks 12 beers daily. Have counseled this constitutes abuse.   Return if symptoms worsen or fail to improve.  The entirety of the information documented in the History of Present Illness, Review of Systems and Physical Exam were personally obtained by me. Portions of this information were initially documented by Kavin Leech, CMA and reviewed by me for thoroughness and accuracy.          Trey Sailors, PA-C  [facility]

## 2017-07-26 NOTE — Patient Instructions (Signed)
Please find vitamin B complex and take on pill daily.   Rash A rash is a change in the color of the skin. A rash can also change the way your skin feels. There are many different conditions and factors that can cause a rash. Follow these instructions at home: Pay attention to any changes in your symptoms. Follow these instructions to help with your condition: Medicine Take or apply over-the-counter and prescription medicines only as told by your doctor. These may include:  Corticosteroid cream.  Anti-itch lotions.  Oral antihistamines.  Skin Care  Put cool compresses on the affected areas.  Try taking a bath with: ? Epsom salts. Follow the instructions on the packaging. You can get these at your local pharmacy or grocery store. ? Baking soda. Pour a small amount into the bath as told by your doctor. ? Colloidal oatmeal. Follow the instructions on the packaging. You can get this at your local pharmacy or grocery store.  Try putting baking soda paste onto your skin. Stir water into baking soda until it gets like a paste.  Do not scratch or rub your skin.  Avoid covering the rash. Make sure the rash is exposed to air as much as possible. General instructions  Avoid hot showers or baths, which can make itching worse. A cold shower may help.  Avoid scented soaps, detergents, and perfumes. Use gentle soaps, detergents, perfumes, and other cosmetic products.  Avoid anything that causes your rash. Keep a journal to help track what causes your rash. Write down: ? What you eat. ? What cosmetic products you use. ? What you drink. ? What you wear. This includes jewelry.  Keep all follow-up visits as told by your doctor. This is important. Contact a doctor if:  You sweat at night.  You lose weight.  You pee (urinate) more than normal.  You feel weak.  You throw up (vomit).  Your skin or the whites of your eyes look yellow (jaundice).  Your skin: ? Tingles. ? Is  numb.  Your rash: ? Does not go away after a few days. ? Gets worse.  You are: ? More thirsty than normal. ? More tired than normal.  You have: ? New symptoms. ? Pain in your belly (abdomen). ? A fever. ? Watery poop (diarrhea). Get help right away if:  Your rash covers all or most of your body. The rash may or may not be painful.  You have blisters that: ? Are on top of the rash. ? Grow larger. ? Grow together. ? Are painful. ? Are inside your nose or mouth.  You have a rash that: ? Looks like purple pinprick-sized spots all over your body. ? Has a "bull's eye" or looks like a target. ? Is red and painful, causes your skin to peel, and is not from being in the sun too long. This information is not intended to replace advice given to you by your health care provider. Make sure you discuss any questions you have with your health care provider. Document Released: 09/22/2007 Document Revised: 09/11/2015 Document Reviewed: 08/21/2014 Elsevier Interactive Patient Education  2018 [employer]

## 2017-08-15 ENCOUNTER — Encounter: Payer: Self-pay | Admitting: Emergency Medicine

## 2017-08-15 ENCOUNTER — Emergency Department: Payer: Self-pay

## 2017-08-15 ENCOUNTER — Other Ambulatory Visit: Payer: Self-pay

## 2017-08-15 ENCOUNTER — Emergency Department
Admission: EM | Admit: 2017-08-15 | Discharge: 2017-08-15 | Disposition: A | Discharge status: Home / Self Care | Payer: Self-pay | Attending: Emergency Medicine | Admitting: Emergency Medicine

## 2017-08-15 DIAGNOSIS — Y998 Other external cause status: Secondary | ICD-10-CM | POA: Insufficient documentation

## 2017-08-15 DIAGNOSIS — F17210 Nicotine dependence, cigarettes, uncomplicated: Secondary | ICD-10-CM | POA: Insufficient documentation

## 2017-08-15 DIAGNOSIS — Y9389 Activity, other specified: Secondary | ICD-10-CM | POA: Insufficient documentation

## 2017-08-15 DIAGNOSIS — Y929 Unspecified place or not applicable: Secondary | ICD-10-CM | POA: Insufficient documentation

## 2017-08-15 DIAGNOSIS — I10 Essential (primary) hypertension: Secondary | ICD-10-CM | POA: Insufficient documentation

## 2017-08-15 DIAGNOSIS — S92001A Unspecified fracture of right calcaneus, initial encounter for closed fracture: Secondary | ICD-10-CM | POA: Insufficient documentation

## 2017-08-15 DIAGNOSIS — W108XXA Fall (on) (from) other stairs and steps, initial encounter: Secondary | ICD-10-CM | POA: Insufficient documentation

## 2017-08-15 LAB — COMPREHENSIVE METABOLIC PANEL
ALK PHOS: 85 U/L (ref 38–126)
ALT: 45 U/L (ref 14–54)
AST: 41 U/L (ref 15–41)
Albumin: 4.1 g/dL (ref 3.5–5.0)
Anion gap: 10 (ref 5–15)
BUN: 11 mg/dL (ref 6–20)
CALCIUM: 8.8 mg/dL — AB (ref 8.9–10.3)
CO2: 25 mmol/L (ref 22–32)
Chloride: 103 mmol/L (ref 101–111)
Creatinine, Ser: 0.69 mg/dL (ref 0.44–1.00)
Glucose, Bld: 119 mg/dL — ABNORMAL HIGH (ref 65–99)
Potassium: 3.9 mmol/L (ref 3.5–5.1)
SODIUM: 138 mmol/L (ref 135–145)
Total Bilirubin: 0.9 mg/dL (ref 0.3–1.2)
Total Protein: 7.6 g/dL (ref 6.5–8.1)

## 2017-08-15 LAB — CBC WITH DIFFERENTIAL/PLATELET
Basophils Absolute: 0.1 10*3/uL (ref 0–0.1)
Basophils Relative: 1 %
EOS ABS: 0 10*3/uL (ref 0–0.7)
EOS PCT: 0 %
HCT: 45.2 % (ref 35.0–47.0)
HEMOGLOBIN: 14.9 g/dL (ref 12.0–16.0)
LYMPHS ABS: 1.2 10*3/uL (ref 1.0–3.6)
Lymphocytes Relative: 16 %
MCH: 30.4 pg (ref 26.0–34.0)
MCHC: 33 g/dL (ref 32.0–36.0)
MCV: 92.3 fL (ref 80.0–100.0)
MONOS PCT: 9 %
Monocytes Absolute: 0.7 10*3/uL (ref 0.2–0.9)
NEUTROS PCT: 74 %
Neutro Abs: 5.9 10*3/uL (ref 1.4–6.5)
Platelets: 115 10*3/uL — ABNORMAL LOW (ref 150–440)
RBC: 4.9 MIL/uL (ref 3.80–5.20)
RDW: 15.2 % — ABNORMAL HIGH (ref 11.5–14.5)
WBC: 7.9 10*3/uL (ref 3.6–11.0)

## 2017-08-15 MED ORDER — MORPHINE SULFATE (PF) 4 MG/ML IV SOLN
4.0000 mg | Freq: Once | INTRAVENOUS | Status: AC
  Administered 2017-08-15: 4 mg via INTRAVENOUS
  Filled 2017-08-15: qty 1

## 2017-08-15 MED ORDER — ONDANSETRON HCL 4 MG/2ML IJ SOLN
4.0000 mg | Freq: Once | INTRAMUSCULAR | Status: AC
  Administered 2017-08-15: 4 mg via INTRAVENOUS
  Filled 2017-08-15: qty 2

## 2017-08-15 MED ORDER — OXYCODONE-ACETAMINOPHEN 5-325 MG PO TABS: 1.0000 | ORAL_TABLET | ORAL | 0 refills | Status: DC | PRN

## 2017-08-15 NOTE — ED Provider Notes (Signed)
[facility] Emergency Department Provider Note  ____________________________________________   First MD Initiated Contact with Patient 08/15/17 1053     (approximate)  I have reviewed the triage vital signs and the nursing notes.   HISTORY  Chief Complaint Ankle Pain    HPI Rebecca Graham is a 62 y.o. female presents emergency department saying she slipped down a flight of steps after she missed the first 2.  She states that she twisted her ankle and has been able to bear weight without it.  States her right ribs are little sore.  She did not hit her head or lose consciousness.  She has been being followed at [facility] for fractured arm  Past Medical History:  Diagnosis Date  . GERD (gastroesophageal reflux disease)   . Pneumonia     Patient Active Problem List   Diagnosis Date Noted  . Essential hypertension 05/08/2015  . OAB (overactive bladder) 05/08/2015    Past Surgical History:  Procedure Laterality Date  . TUBAL LIGATION  1987    Prior to Admission medications   Medication Sig Start Date End Date Taking? Authorizing Provider  acetaminophen (TYLENOL) 500 MG tablet Take 500 mg by mouth every 6 (six) hours as needed.    [provider]  oxyCODONE-acetaminophen (PERCOCET/ROXICET) 5-325 MG tablet Take 1 tablet by mouth every 4 (four) hours as needed for severe pain. 08/15/17   Faythe Ghee, PA-C    Allergies Aspirin and Penicillins  Family History  Problem Relation Age of Onset  . Multiple sclerosis Mother   . Colon cancer Father   . Lung cancer Sister   . Hypertension Daughter     Social History Social History   Tobacco Use  . Smoking status: Current Every Day Smoker    Packs/day: 1.00    Years: 45.00    Pack years: 45.00    Types: Cigarettes  . Smokeless tobacco: Never Used  Substance Use Topics  . Alcohol use: Yes    Alcohol/week: 7.2 oz    Types: 12 Cans of beer per week  . Drug use: No     Review of Systems  Constitutional: No fever/chills Eyes: No visual changes. ENT: No sore throat. Respiratory: Denies cough Genitourinary: Negative for dysuria. Musculoskeletal: Negative for back pain.  Positive for right rib and ankle pain Skin: Negative for rash.    ____________________________________________   PHYSICAL EXAM:  VITAL SIGNS: ED Triage Vitals  Enc Vitals Group     BP 08/15/17 1042 135/88     Pulse Rate 08/15/17 1042 89     Resp 08/15/17 1042 18     Temp 08/15/17 1042 98.3 F (36.8 C)     Temp Source 08/15/17 1042 Oral     SpO2 08/15/17 1042 100 %     Weight 08/15/17 1044 147 lb (66.7 kg)     Height 08/15/17 1044  (1.676 m)     Head Circumference --      Peak Flow --      Pain Score 08/15/17 1043 10     Pain Loc --      Pain Edu? --      Excl. in GC? --     Constitutional: Alert and oriented. Well appearing and in no acute distress. Eyes: Conjunctivae are normal.  Head: Atraumatic. Nose: No congestion/rhinnorhea. Mouth/Throat: Mucous membranes are moist.  Neck: Is supple, no lymphadenopathy, the cervical spine is tender Cardiovascular: Normal rate, regular rhythm.  Heart sounds are normal Respiratory:  Normal respiratory effort.  No retractions, lungs clear to auscultation GU: deferred Musculoskeletal: The right ankle is grossly swollen and tender at the calcaneus.  She has decreased range of motion secondary to pain.  She is neurovascularly intact.  Cap refill is less than 1 second.  Right ribs are minimally tender Neurologic:  Normal speech and language.  Skin:  Skin is warm, dry and intact. No rash noted. Psychiatric: Mood and affect are normal. Speech and behavior are normal.  ____________________________________________   LABS (all labs ordered are listed, but only abnormal results are displayed)  Labs Reviewed  CBC WITH DIFFERENTIAL/PLATELET - Abnormal; Notable for the following components:      Result Value   RDW 15.2 (*)     Platelets 115 (*)    All other components within normal limits  COMPREHENSIVE METABOLIC PANEL - Abnormal; Notable for the following components:   Glucose, Bld 119 (*)    Calcium 8.8 (*)    All other components within normal limits   ____________________________________________   ____________________________________________  RADIOLOGY  X-ray of the right ankle shows a calcaneal fracture which is comminuted  ____________________________________________   PROCEDURES  Procedure(s) performed:   .Splint Application Date/Time: 08/15/2017 1:28 PM Performed by: Faythe Ghee, PA-C Authorized by: Faythe Ghee, PA-C   Consent:    Consent obtained:  Verbal   Consent given by:  Patient   Risks discussed:  Discoloration, numbness, pain and swelling   Alternatives discussed:  No treatment Pre-procedure details:    Sensation:  Normal Procedure details:    Laterality:  Right   Location:  Ankle   Ankle:  R ankle   Strapping: no     Splint type:  Short leg   Supplies:  Ortho-Glass Post-procedure details:    Pain:  Improved   Patient tolerance of procedure:  Tolerated well, no immediate complications Comments:     Patient was placed in a posterior OCL with sugar tong per Dr. Allena Katz, the [facility] was applied by the tech      ____________________________________________   INITIAL IMPRESSION / ASSESSMENT AND PLAN / ED COURSE  Pertinent labs & imaging results that were available during my care of the patient were reviewed by me and considered in my medical decision making (see chart for details).  Patient is a 62 year old female who reports emergency department after a fall down 5 steps yesterday.  She is unable to bear weight on the right ankle.  Loss of consciousness.  On physical exam the right ankle is swollen and tender at the calcaneus  X-ray of the right ankle shows a calcaneal fracture, comminuted and mildly displaced.  X-ray results were explained to the  patient. Paged Dr. Allena Katz who is coming in to see her at bedside.  Dr. Allena Katz evaluated the patient.  He states she will be able to be discharged.  He instructed [place] to put her in a posterior OCL with sugar tong and be nonweightbearing.  I expressed my concerns of her using a walker or crutches due to the fractured arm.  After looking at the arm x-ray he states that she should be able to use a walker.  All instructions were explained to the patient.  She is to call orthopedics for follow-up.  Explained she may actually need podiatry more than general orthopedics.  States she understands.  She will call make an appointment.  She was given a walker at discharge.  Percocet 5/325.  She is to keep foot elevated and iced.  She states she understands all instructions and will comply.  She was discharged in stable condition     As part of my medical decision making, I reviewed the following data within the electronic MEDICAL RECORD NUMBER Nursing notes reviewed and incorporated, Old chart reviewed, Radiograph reviewed x-ray of the right ankle shows a comminuted calcaneal fracture, Notes from prior ED visits and [place] Controlled Substance Database  ____________________________________________   FINAL CLINICAL IMPRESSION(S) / ED DIAGNOSES  Final diagnoses:  Closed nondisplaced fracture of right calcaneus, unspecified portion of calcaneus, initial encounter      NEW MEDICATIONS STARTED DURING THIS VISIT:  New Prescriptions   OXYCODONE-ACETAMINOPHEN (PERCOCET/ROXICET) 5-325 MG TABLET    Take 1 tablet by mouth every 4 (four) hours as needed for severe pain.     Note:  This document was prepared using Dragon voice recognition software and may include unintentional dictation errors.    Faythe Ghee, PA-C 08/15/17 1332    Jene Every, MD 08/15/17 [phone]

## 2017-08-15 NOTE — ED Notes (Signed)
Walker given per order.

## 2017-08-15 NOTE — ED Triage Notes (Signed)
R ankle pain since fell down steps yesterday.

## 2017-08-15 NOTE — Consult Note (Addendum)
ORTHOPAEDIC CONSULTATION  REQUESTING PHYSICIAN: Rebecca Every, MD  Chief Complaint:   R heelk pain  History of Present Illness: Rebecca Graham is a 62 y.o. femalewho had a fall at home last night.  The patient noted immediate R heel pain and inability to ambulate.  Pain is described as sharp at its worst and a dull ache at its best.  Pain is rated a 8 out of 10 in severity.  Pain is improved with rest and immobilization.  Pain is worse with any sort of movement or attempted weightbearing.  X-rays in the emergency department show a right calcaneus fracture.  Patient does smoke daily, but has been trying to quit. She also has a R distal ulna fracture that has been treated non-operatively. This was sustained on 06/07/17. She is seeing Rebecca Clines, PA for this.  Past Medical History:  Diagnosis Date  . GERD (gastroesophageal reflux disease)   . Pneumonia    Past Surgical History:  Procedure Laterality Date  . TUBAL LIGATION  1987   Social History   Socioeconomic History  . Marital status: Single    Spouse name: Not on file  . Number of children: Not on file  . Years of education: Not on file  . Highest education level: Not on file  Occupational History  . Not on file  Social Needs  . Financial resource strain: Not on file  . Food insecurity:    Worry: Not on file    Inability: Not on file  . Transportation needs:    Medical: Not on file    Non-medical: Not on file  Tobacco Use  . Smoking status: Current Graham Day Smoker    Packs/day: 1.00    Years: 45.00    Pack years: 45.00    Types: Cigarettes  . Smokeless tobacco: Never Used  Substance and Sexual Activity  . Alcohol use: Yes    Alcohol/week: 7.2 oz    Types: 12 Cans of beer per week  . Drug use: No  . Sexual activity: Not on file  Lifestyle  . Physical activity:    Days per week: Not on file    Minutes per session: Not on file  . Stress: Not on  file  Relationships  . Social connections:    Talks on phone: Not on file    Gets together: Not on file    Attends religious service: Not on file    Active member of club or organization: Not on file    Attends meetings of clubs or organizations: Not on file    Relationship status: Not on file  Other Topics Concern  . Not on file  Social History Narrative  . Not on file   Family History  Problem Relation Age of Onset  . Multiple sclerosis Mother   . Colon cancer Father   . Lung cancer Sister   . Hypertension Daughter    Allergies  Allergen Reactions  . Aspirin Rash  . Penicillins Rash   Prior to Admission medications   Medication Sig Start Date End Date Taking? Authorizing Provider  acetaminophen (TYLENOL) 500 MG tablet Take 500 mg by mouth Graham 6 (six) hours as needed.    [provider]  triamcinolone cream (KENALOG) 0.1 % Apply 1 application topically 2 (two) times daily. 07/26/17   Rebecca Sailors, PA-C   No results for input(s): WBC, HGB, HCT, PLT, K, CL, CO2, BUN, CREATININE, GLUCOSE, CALCIUM, LABPT, INR in the last 72 hours. Dg Ankle Complete  Right  Result Date: 08/15/2017 CLINICAL DATA:  Generalized right ankle pain. Fall down steps yesterday. EXAM: RIGHT ANKLE - COMPLETE 3+ VIEW COMPARISON:  None. FINDINGS: Lateral soft tissue swelling. Diffuse soft tissue swelling, most pronounced laterally. Fracture noted through the posterior and midportion of the calcaneus. Mildly displaced fracture fragments. No additional acute bony abnormality. IMPRESSION: Comminuted, mildly displaced mid and posterior calcaneal fracture. Electronically Signed   By: Rebecca Graham M.D.   On: 08/15/2017 11:22     Positive ROS: All other systems have been reviewed and were otherwise negative with the exception of those mentioned in the HPI and as above.  Physical Exam: BP 135/88   Pulse 89   Temp 98.3 F (36.8 C) (Oral)   Resp 18   Ht  (1.676 m)   Wt 66.7 kg (147 lb)   SpO2  100%   BMI 23.73 kg/m  General:  Alert, no acute distress Psychiatric:  Patient is competent for consent with normal mood and affect   Cardiovascular:  No pedal edema, regular rate and rhythm Respiratory:  No wheezing, non-labored breathing GI:  Abdomen is soft and non-tender Skin:  No lesions in the area of chief complaint, no erythema Neurologic:  Sensation intact distally, CN grossly intact Lymphatic:  No axillary or cervical lymphadenopathy  Orthopedic Exam:  RLE: Able to wiggle toes SILT s/s/t/sp/dp distr Foot wwp, +DP pulse +TTP over heel, no TTP over midfoot/forefoot or proximally over tibia/fibula   X-rays:  As above: R calcaneus fracture without significant joint depression  X-rays R forearm (from [facility]) 08/02/17: maintained alignment with significant callus formation at distal ulna fracture site  Assessment/Plan: Rebecca Graham is a 62 y.o. female with a R calcaneus fracture without significant joint depression   1. I discussed the various treatment options including both surgical and non-surgical management of her fracture with the patient. Given relatively well-maintained joint height, we elected to proceed with non-surgical management.  2. NWB RLE. Can likely start to bear weight through RUE given radiographic healing on xrays ~2 weeks ago. Can use walker for mobility. 3. ED staff to place RLE short leg splint 4. F/u with me or with podiatry in ~1-2 weeks.       Rebecca Graham   08/15/2017 12:08 PM

## 2017-08-15 NOTE — Discharge Instructions (Signed)
You have a calcaneal fracture.  Please call [facility] clinic for a follow-up appointment.  Do not bear weight on this ankle.  Use the walker for support.  Keep the foot elevated and apply ice at least 3 times a day daily for 15 minutes at a time.  Use medication as needed for pain.

## 2017-08-15 NOTE — ED Notes (Signed)
See triage note  Presents s/p fall  States she slipped down about 2 steps  Twisted right ankle  Also having some pain to right latera rib area  positive swelling noted  With pulses

## 2019-02-06 ENCOUNTER — Emergency Department
Admission: EM | Admit: 2019-02-06 | Discharge: 2019-02-06 | Disposition: A | Discharge status: Home / Self Care | Payer: PRIVATE HEALTH INSURANCE | Attending: Emergency Medicine | Admitting: Emergency Medicine

## 2019-02-06 ENCOUNTER — Encounter: Payer: Self-pay | Admitting: Emergency Medicine

## 2019-02-06 ENCOUNTER — Other Ambulatory Visit: Payer: Self-pay

## 2019-02-06 DIAGNOSIS — N3001 Acute cystitis with hematuria: Secondary | ICD-10-CM | POA: Insufficient documentation

## 2019-02-06 DIAGNOSIS — I10 Essential (primary) hypertension: Secondary | ICD-10-CM | POA: Insufficient documentation

## 2019-02-06 DIAGNOSIS — R519 Headache, unspecified: Secondary | ICD-10-CM | POA: Diagnosis not present

## 2019-02-06 DIAGNOSIS — R350 Frequency of micturition: Secondary | ICD-10-CM | POA: Diagnosis present

## 2019-02-06 DIAGNOSIS — F17210 Nicotine dependence, cigarettes, uncomplicated: Secondary | ICD-10-CM | POA: Diagnosis not present

## 2019-02-06 DIAGNOSIS — A599 Trichomoniasis, unspecified: Secondary | ICD-10-CM | POA: Diagnosis not present

## 2019-02-06 LAB — WET PREP, GENITAL
Clue Cells Wet Prep HPF POC: NONE SEEN
Sperm: NONE SEEN
Yeast Wet Prep HPF POC: NONE SEEN

## 2019-02-06 LAB — URINALYSIS, COMPLETE (UACMP) WITH MICROSCOPIC
Bilirubin Urine: NEGATIVE
Glucose, UA: NEGATIVE mg/dL
Ketones, ur: NEGATIVE mg/dL
Nitrite: NEGATIVE
Protein, ur: NEGATIVE mg/dL
Specific Gravity, Urine: 1.019 (ref 1.005–1.030)
pH: 5 (ref 5.0–8.0)

## 2019-02-06 MED ORDER — FLUCONAZOLE 150 MG PO TABS: 150.0000 mg | ORAL_TABLET | Freq: Every day | ORAL | 1 refills | Status: AC

## 2019-02-06 MED ORDER — SULFAMETHOXAZOLE-TRIMETHOPRIM 800-160 MG PO TABS: 1.0000 | ORAL_TABLET | Freq: Two times a day (BID) | ORAL | 0 refills | Status: DC

## 2019-02-06 MED ORDER — METRONIDAZOLE 500 MG PO TABS: 500.0000 mg | ORAL_TABLET | Freq: Two times a day (BID) | ORAL | 0 refills | Status: DC

## 2019-02-06 NOTE — ED Triage Notes (Signed)
Presents with urinary freg and vaginal discharge  States she has tried OTC monostat w/o relief  Also is having some sinus pressure and pain

## 2019-02-06 NOTE — ED Notes (Signed)
Patient did not stay to get discharge vitals. Pt left after receiving discharge papers.

## 2019-02-06 NOTE — ED Provider Notes (Signed)
[facility] Emergency Department Provider Note       Time seen: ----------------------------------------- 9:21 AM on 02/06/2019 -----------------------------------------   I have reviewed the triage vital signs and the nursing notes.  HISTORY   Chief Complaint No chief complaint on file.    HPI Rebecca Graham is a 63 y.o. female with a history of GERD, pneumonia, hypertension who presents to the ED for urinary frequency with vaginal discharge.  Patient has tried over-the-counter Monistat without any relief.  She is also having some sinus pressure and pain.  Past Medical History:  Diagnosis Date  . GERD (gastroesophageal reflux disease)   . Pneumonia     Patient Active Problem List   Diagnosis Date Noted  . Essential hypertension 05/08/2015  . OAB (overactive bladder) 05/08/2015    Past Surgical History:  Procedure Laterality Date  . TUBAL LIGATION  1987    Allergies Aspirin and Penicillins  Social History Social History   Tobacco Use  . Smoking status: Current Every Day Smoker    Packs/day: 1.00    Years: 45.00    Pack years: 45.00    Types: Cigarettes  . Smokeless tobacco: Never Used  Substance Use Topics  . Alcohol use: Yes    Alcohol/week: 12.0 standard drinks    Types: 12 Cans of beer per week  . Drug use: No   Review of Systems Constitutional: Negative for fever. Cardiovascular: Negative for chest pain. Respiratory: Negative for shortness of breath. Gastrointestinal: Negative for abdominal pain, vomiting and diarrhea. Genitourinary: Positive for urinary frequency and vaginal discharge Musculoskeletal: Negative for back pain. Skin: Negative for rash. Neurological: Negative for headaches, focal weakness or numbness.  All systems negative/normal/unremarkable except as stated in the HPI  ____________________________________________   PHYSICAL EXAM:  VITAL SIGNS: ED Triage Vitals  Enc Vitals Group     BP 02/06/19  0907 (!) 151/86     Pulse Rate 02/06/19 0907 89     Resp 02/06/19 0907 18     Temp 02/06/19 0907 98.2 F (36.8 C)     Temp Source 02/06/19 0907 Oral     SpO2 02/06/19 0907 97 %     Weight 02/06/19 0908 135 lb (61.2 kg)     Height 02/06/19 0908 5\' 6"  (1.676 m)     Head Circumference --      Peak Flow --      Pain Score 02/06/19 0908 0     Pain Loc --      Pain Edu? --      Excl. in GC? --    Constitutional: Alert and oriented. Well appearing and in no distress. Eyes: Conjunctivae are normal. Normal extraocular movements. Cardiovascular: Normal rate, regular rhythm. No murmurs, rubs, or gallops. Respiratory: Normal respiratory effort without tachypnea nor retractions. Breath sounds are clear and equal bilaterally. No wheezes/rales/rhonchi. Gastrointestinal: Soft and nontender. Normal bowel sounds Genitourinary: unremarkable, nontender, no significant discharge Musculoskeletal: Nontender with normal range of motion in extremities. No lower extremity tenderness nor edema. Neurologic:  Normal speech and language. No gross focal neurologic deficits are appreciated.  Skin:  Skin is warm, dry and intact. No rash noted. Psychiatric: Mood and affect are normal. Speech and behavior are normal.  ____________________________________________  ED COURSE:  As part of my medical decision making, I reviewed the following data within the electronic MEDICAL RECORD NUMBER History obtained from family if available, nursing notes, old chart and ekg, as well as notes from prior ED visits. Patient presented for urinary frequency  vaginal discharge and sinus pressure, we will assess with labs and imaging as indicated at this time. Clinical Course as of Feb 06 1108  Tue Feb 06, 2019  0934 Bacteria, UA(!): RARE [JE]    Clinical Course User Index [JE] [name]   Procedures  Dyllan A Cragun was evaluated in Emergency Department on 02/06/2019 for the symptoms described in the history of  present illness. She was evaluated in the context of the global COVID-19 pandemic, which necessitated consideration that the patient might be at risk for infection with the SARS-CoV-2 virus that causes COVID-19. Institutional protocols and algorithms that pertain to the evaluation of patients at risk for COVID-19 are in a state of rapid change based on information released by regulatory bodies including the CDC and federal and state organizations. These policies and algorithms were followed during the patient's care in the ED.  ____________________________________________   LABS (pertinent positives/negatives)  Labs Reviewed  URINALYSIS, COMPLETE (UACMP) WITH MICROSCOPIC - Abnormal; Notable for the following components:      Result Value   Color, Urine YELLOW (*)    APPearance CLOUDY (*)    Hgb urine dipstick SMALL (*)    Leukocytes,Ua LARGE (*)    Bacteria, UA RARE (*)    All other components within normal limits  URINE CULTURE  WET PREP, GENITAL   ____________________________________________   DIFFERENTIAL DIAGNOSIS   UTI, yeast infection, STD, overactive bladder  FINAL ASSESSMENT AND PLAN  UTI   Plan: The patient had presented for urinary symptoms. Patient's labs did indicate likely UTI.  Patient will be given antibiotics as well as Diflucan.  She is cleared for outpatient follow-up.   Laurence Aly, MD    Note: This note was generated in part or whole with voice recognition software. Voice recognition is usually quite accurate but there are transcription errors that can and very often do occur. I apologize for any typographical errors that were not detected and corrected.     Earleen Newport, MD 02/06/19 1110

## 2019-02-07 LAB — URINE CULTURE
Culture: NO GROWTH
Special Requests: NORMAL

## 2019-04-20 IMAGING — DX DG ANKLE COMPLETE 3+V*R*
3 series · 3 of 3 positions shown · non-contrast
Comparison: None.

CLINICAL DATA: Generalized right ankle pain. Fall down steps
yesterday.

EXAM:
RIGHT ANKLE - COMPLETE 3+ VIEW

[ankle ap]
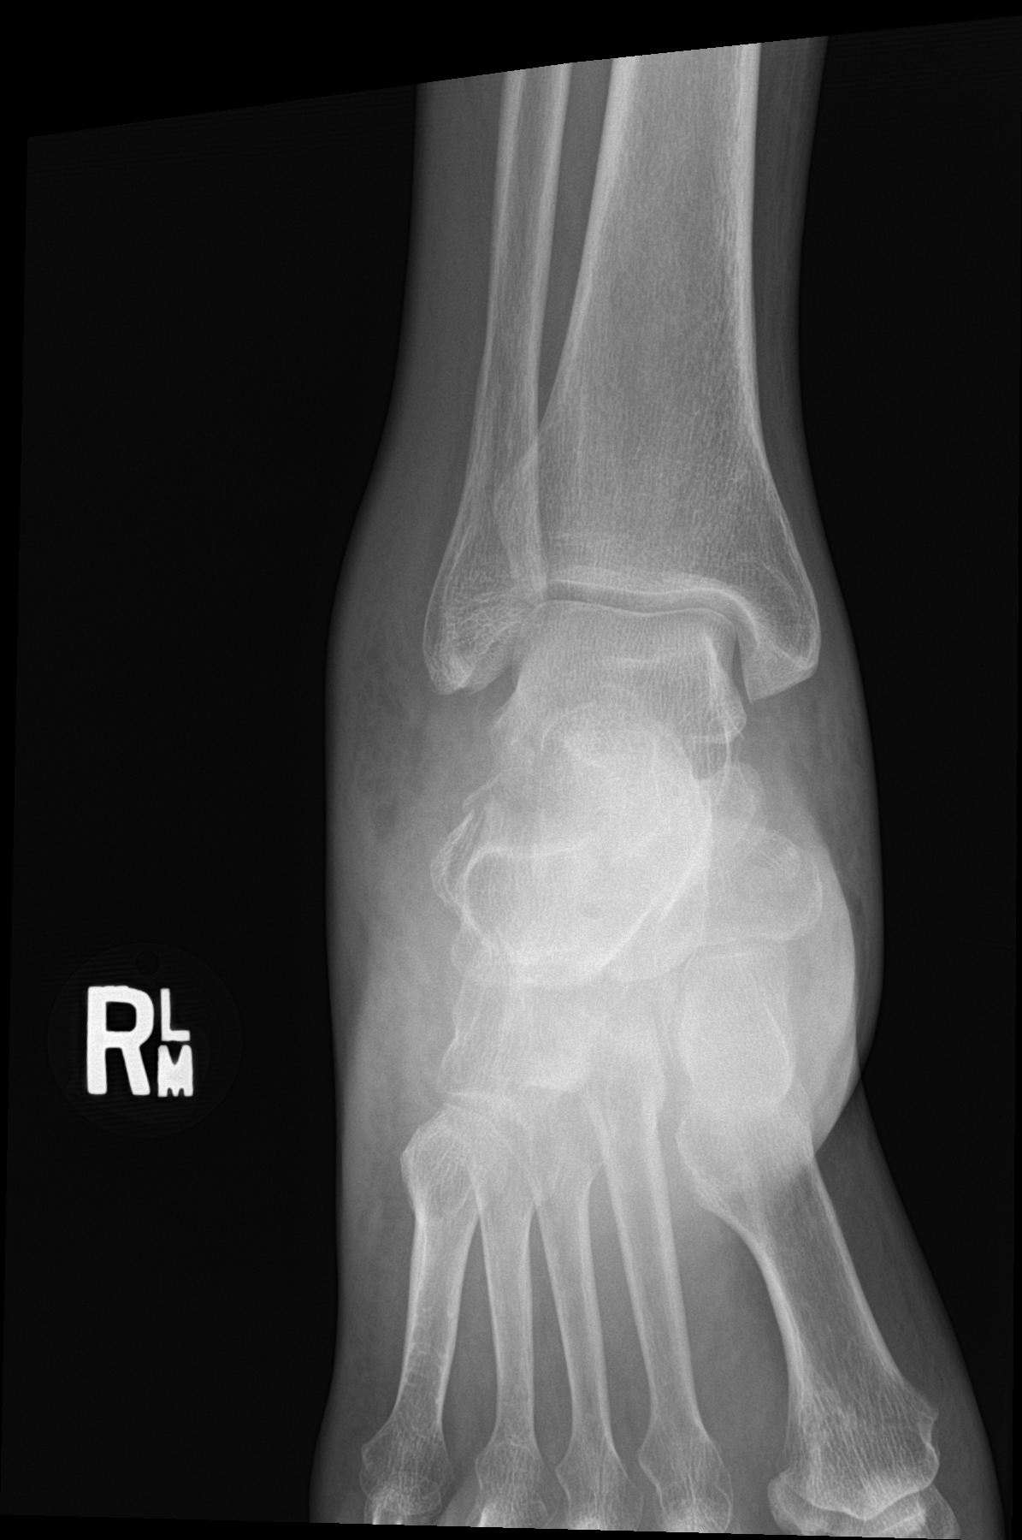

[ankle obl]
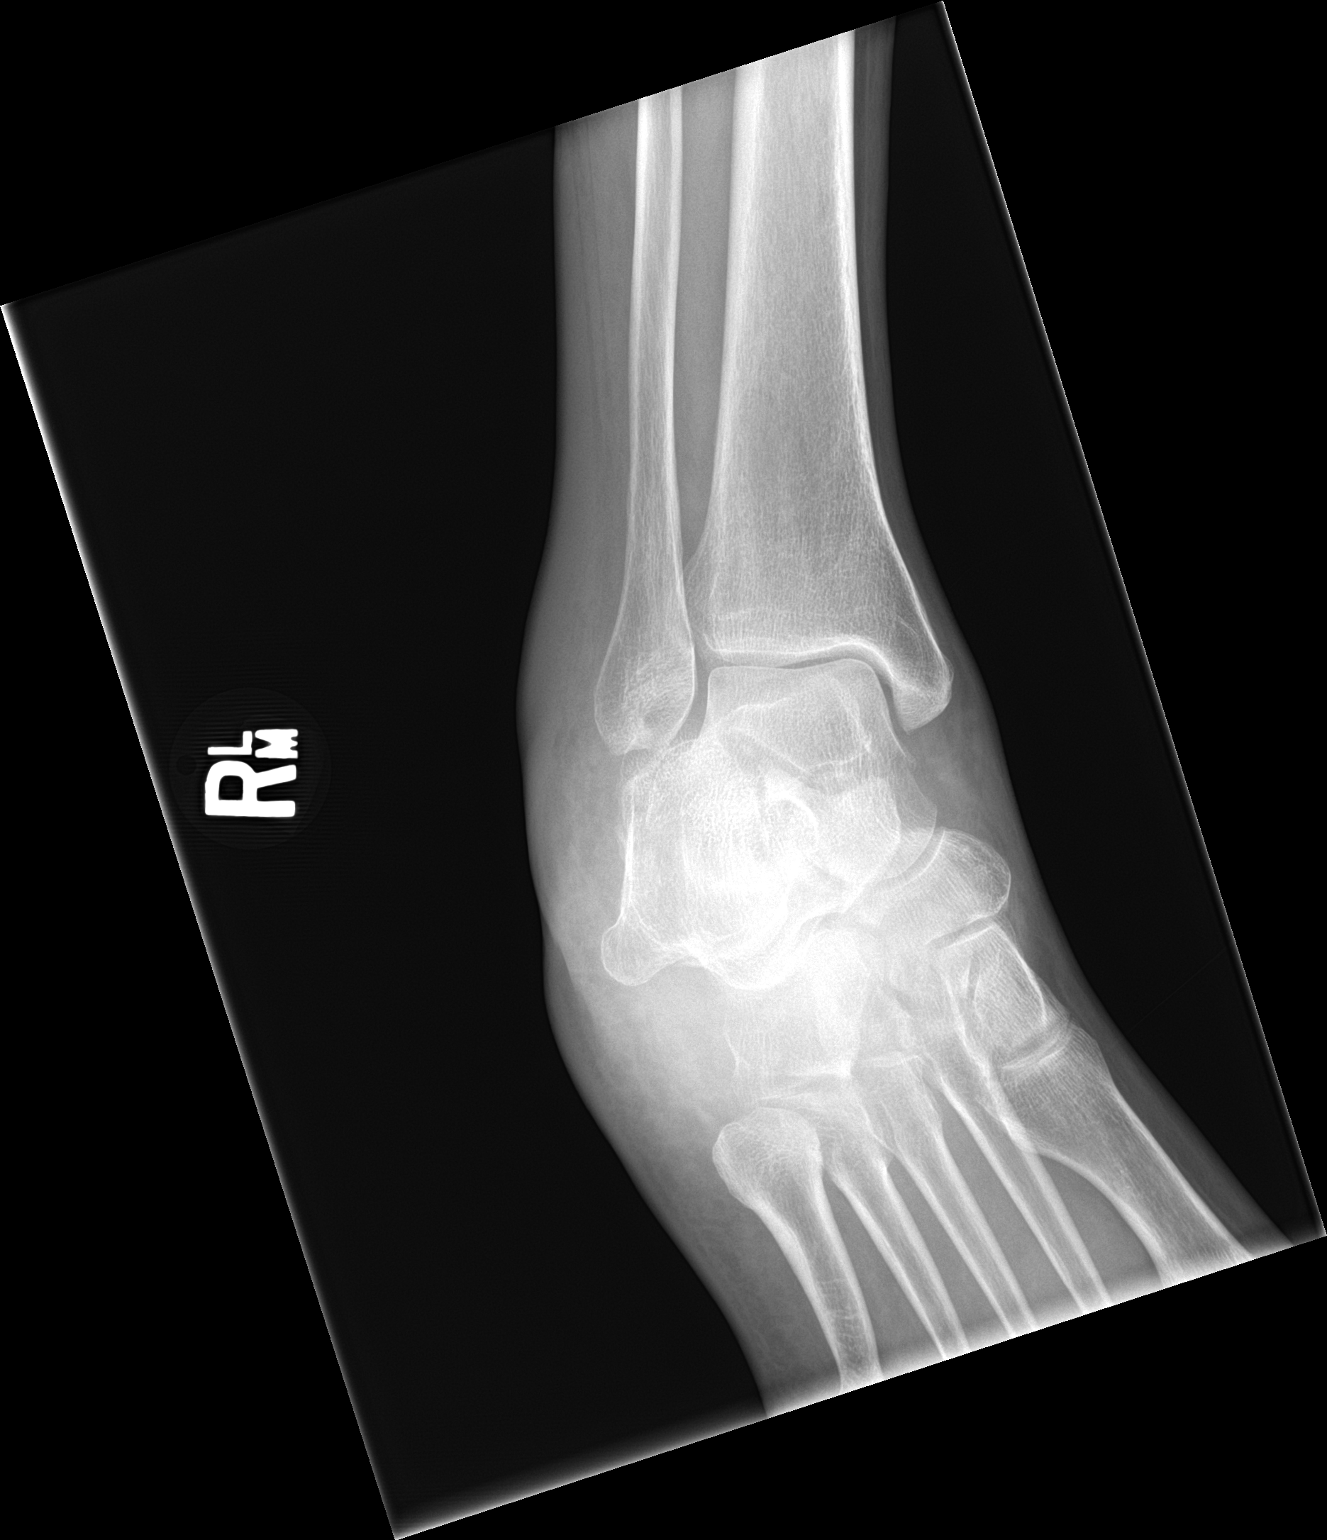

[ankle lat]
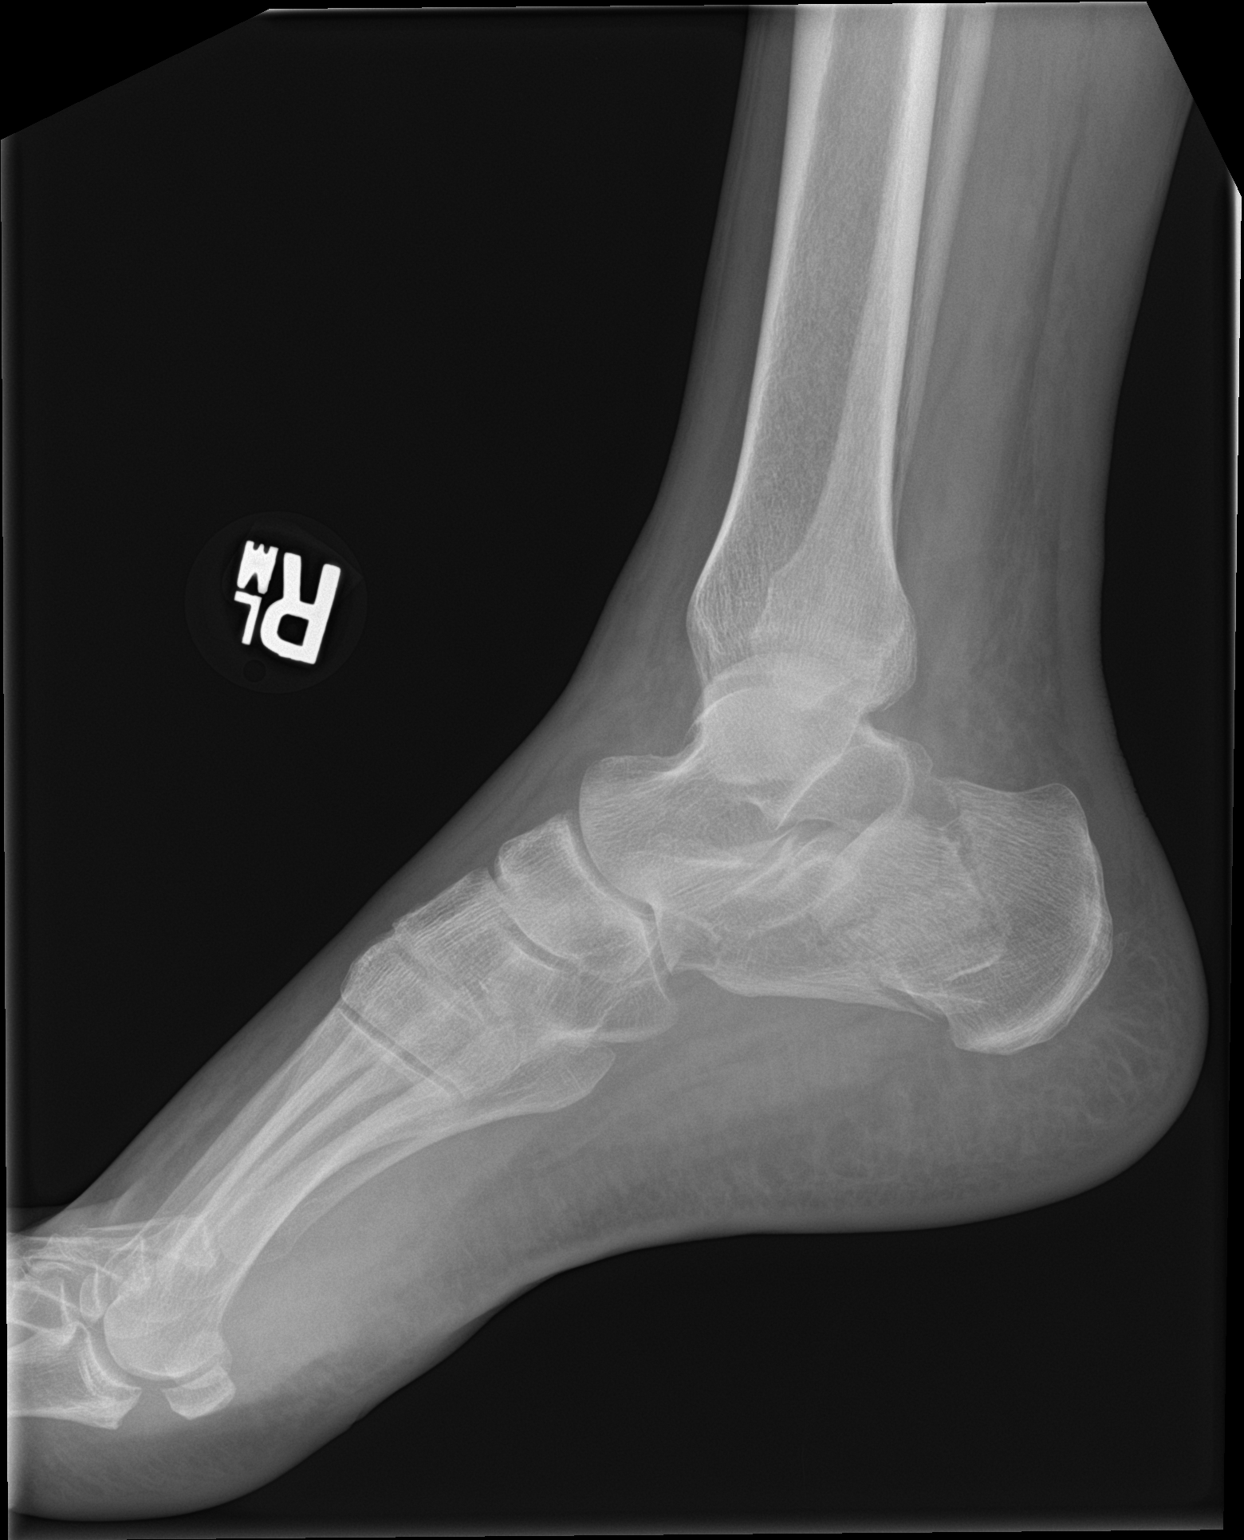

[3 of 3 positions shown; findings below may reference images not displayed]

FINDINGS: Lateral soft tissue swelling. Diffuse soft tissue swelling, most
pronounced laterally. Fracture noted through the posterior and
midportion of the calcaneus. Mildly displaced fracture fragments. No
additional acute bony abnormality.
IMPRESSION: Comminuted, mildly displaced mid and posterior calcaneal fracture.

## 2019-05-22 ENCOUNTER — Emergency Department: Payer: Medicare Other

## 2019-05-22 ENCOUNTER — Emergency Department
Admission: EM | Admit: 2019-05-22 | Discharge: 2019-05-22 | Disposition: A | Discharge status: Home / Self Care | Payer: Medicare Other | Attending: Emergency Medicine | Admitting: Emergency Medicine

## 2019-05-22 ENCOUNTER — Encounter: Payer: Self-pay | Admitting: Intensive Care

## 2019-05-22 ENCOUNTER — Other Ambulatory Visit: Payer: Self-pay

## 2019-05-22 DIAGNOSIS — R0789 Other chest pain: Secondary | ICD-10-CM | POA: Diagnosis not present

## 2019-05-22 DIAGNOSIS — I10 Essential (primary) hypertension: Secondary | ICD-10-CM | POA: Diagnosis not present

## 2019-05-22 DIAGNOSIS — F17210 Nicotine dependence, cigarettes, uncomplicated: Secondary | ICD-10-CM | POA: Diagnosis not present

## 2019-05-22 DIAGNOSIS — I319 Disease of pericardium, unspecified: Secondary | ICD-10-CM

## 2019-05-22 DIAGNOSIS — R079 Chest pain, unspecified: Secondary | ICD-10-CM | POA: Insufficient documentation

## 2019-05-22 HISTORY — DX: Essential (primary) hypertension: I10

## 2019-05-22 HISTORY — DX: Disease of pericardium, unspecified: I31.9

## 2019-05-22 LAB — BASIC METABOLIC PANEL
Anion gap: 10 (ref 5–15)
BUN: 7 mg/dL — ABNORMAL LOW (ref 8–23)
CO2: 25 mmol/L (ref 22–32)
Calcium: 8.9 mg/dL (ref 8.9–10.3)
Chloride: 106 mmol/L (ref 98–111)
Creatinine, Ser: 0.51 mg/dL (ref 0.44–1.00)
GFR calc Af Amer: >60 mL/min (ref >60)
GFR calc non Af Amer: >60 mL/min (ref >60)
Glucose, Bld: 130 mg/dL — ABNORMAL HIGH (ref 70–99)
Potassium: 3.9 mmol/L (ref 3.5–5.1)
Sodium: 141 mmol/L (ref 135–145)

## 2019-05-22 LAB — CBC
HCT: 44.1 % (ref 36.0–46.0)
Hemoglobin: 14.4 g/dL (ref 12.0–15.0)
MCH: 31.9 pg (ref 26.0–34.0)
MCHC: 32.7 g/dL (ref 30.0–36.0)
MCV: 97.6 fL (ref 80.0–100.0)
Platelets: 97 10*3/uL — ABNORMAL LOW (ref 150–400)
RBC: 4.52 MIL/uL (ref 3.87–5.11)
RDW: 15.5 % (ref 11.5–15.5)
WBC: 6.6 10*3/uL (ref 4.0–10.5)
nRBC: 0 % (ref 0.0–0.2)

## 2019-05-22 LAB — TROPONIN I (HIGH SENSITIVITY)
Troponin I (High Sensitivity): 5 ng/L (ref <18)
Troponin I (High Sensitivity): 4 ng/L (ref <18)

## 2019-05-22 MED ORDER — OXYCODONE-ACETAMINOPHEN 5-325 MG PO TABS
2.0000 | ORAL_TABLET | Freq: Once | ORAL | Status: AC
  Administered 2019-05-22: 2 via ORAL
  Filled 2019-05-22: qty 2

## 2019-05-22 MED ORDER — COLCHICINE 0.6 MG PO TABS: 0.6000 mg | ORAL_TABLET | Freq: Two times a day (BID) | ORAL | 2 refills | Status: DC

## 2019-05-22 MED ORDER — NITROGLYCERIN 0.4 MG SL SUBL
0.4000 mg | SUBLINGUAL_TABLET | SUBLINGUAL | Status: DC | PRN
  Administered 2019-05-22 (×3): 0.4 mg via SUBLINGUAL
  Filled 2019-05-22: qty 1

## 2019-05-22 MED ORDER — IOHEXOL 350 MG/ML SOLN
75.0000 mL | Freq: Once | INTRAVENOUS | Status: AC | PRN
  Administered 2019-05-22: 60 mL via INTRAVENOUS

## 2019-05-22 NOTE — Progress Notes (Signed)
This patient has received 64 ml's of IV Omni 350 contrast extravasation into left biceps during a CTA Chest. The exam was performed on 05/22/19  Site assessed by Dr. Deanne Coffer.

## 2019-05-22 NOTE — ED Provider Notes (Signed)
[facility] Emergency Department Provider Note       Time seen: ----------------------------------------- 9:18 AM on 05/22/2019 -----------------------------------------   I have reviewed the triage vital signs and the nursing notes.  HISTORY   Chief Complaint Chest Pain    HPI Rebecca Graham is a 64 y.o. female with a history of GERD, pneumonia, overactive bladder who presents to the ED for chest pain.  Patient describes 8 out of 10 acute chest pain, also has a pleuritic component where it hurts to breathe.  She is never had this before, denies fevers, chills, vomiting or diarrhea.  Past Medical History:  Diagnosis Date  . GERD (gastroesophageal reflux disease)   . Pneumonia     Patient Active Problem List   Diagnosis Date Noted  . Essential hypertension 05/08/2015  . OAB (overactive bladder) 05/08/2015    Past Surgical History:  Procedure Laterality Date  . TUBAL LIGATION  1987    Allergies Aspirin and Penicillins  Social History Social History   Tobacco Use  . Smoking status: Current Every Day Smoker    Packs/day: 1.00    Years: 45.00    Pack years: 45.00    Types: Cigarettes  . Smokeless tobacco: Never Used  Substance Use Topics  . Alcohol use: Yes    Alcohol/week: 12.0 standard drinks    Types: 12 Cans of beer per week  . Drug use: No    Review of Systems Constitutional: Negative for fever. Cardiovascular: Positive for chest pain Respiratory: Positive for shortness of breath and cough Gastrointestinal: Negative for abdominal pain, vomiting and diarrhea. Musculoskeletal: Negative for back pain. Skin: Negative for rash. Neurological: Negative for headaches, focal weakness or numbness.  All systems negative/normal/unremarkable except as stated in the HPI  ____________________________________________   PHYSICAL EXAM:  VITAL SIGNS: ED Triage Vitals [05/22/19 0916]  Enc Vitals Group     BP (!) 142/81     Pulse  Rate 74     Resp 18     Temp      Temp src      SpO2 100 %     Weight 126 lb (57.2 kg)     Height 5\' 6"  (1.676 m)     Head Circumference      Peak Flow      Pain Score 8     Pain Loc      Pain Edu?      Excl. in [place]?     Constitutional: Alert and oriented. Well appearing and in no distress. Eyes: Conjunctivae are normal. Normal extraocular movements. ENT      Head: Normocephalic and atraumatic.      Nose: No congestion/rhinnorhea.      Mouth/Throat: Mucous membranes are moist.      Neck: No stridor. Cardiovascular: Normal rate, regular rhythm. No murmurs, rubs, or gallops. Respiratory: Normal respiratory effort without tachypnea nor retractions. Breath sounds are clear and equal bilaterally. No wheezes/rales/rhonchi. Gastrointestinal: Soft and nontender. Normal bowel sounds Musculoskeletal: Nontender with normal range of motion in extremities. No lower extremity tenderness nor edema. Neurologic:  Normal speech and language. No gross focal neurologic deficits are appreciated.  Skin:  Skin is warm, dry and intact. No rash noted. Psychiatric: Mood and affect are normal. Speech and behavior are normal.  ____________________________________________  EKG: Interpreted by me.  Sinus rhythm with rate of 78 bpm, ST elevation in the inferior leads also V5 and V6, normal QT  Repeat EKG interpreted by me, sinus rhythm with rate of  70 bpm, position in the inferior and anterior leads.  Normal QT ____________________________________________  ED COURSE:  As part of my medical decision making, I reviewed the following data within the electronic MEDICAL RECORD NUMBER History obtained from family if available, nursing notes, old chart and ekg, as well as notes from prior ED visits. Patient presented for chest pain, we will assess with labs and imaging as indicated at this time.   Procedures  Rebecca Graham was evaluated in Emergency Department on 05/22/2019 for the symptoms described in the history  of present illness. She was evaluated in the context of the global COVID-19 pandemic, which necessitated consideration that the patient might be at risk for infection with the SARS-CoV-2 virus that causes COVID-19. Institutional protocols and algorithms that pertain to the evaluation of patients at risk for COVID-19 are in a state of rapid change based on information released by regulatory bodies including the CDC and federal and state organizations. These policies and algorithms were followed during the patient's care in the ED.  ____________________________________________   LABS (pertinent positives/negatives)  Labs Reviewed  BASIC METABOLIC PANEL - Abnormal; Notable for the following components:      Result Value   Glucose, Bld 130 (*)    BUN 7 (*)    All other components within normal limits  CBC - Abnormal; Notable for the following components:   Platelets 97 (*)    All other components within normal limits  TROPONIN I (HIGH SENSITIVITY)  TROPONIN I (HIGH SENSITIVITY)    RADIOLOGY Images were viewed by me  Chest x-ray/CTA IMPRESSION:  Negative for pulmonary embolus. No acute disease.   Aortic Atherosclerosis (ICD10-I70.0) and Emphysema (ICD10-J43.9).  ____________________________________________   DIFFERENTIAL DIAGNOSIS   Musculoskeletal pain, GERD, PE, pneumothorax, MI, pneumonia, COVID-19  FINAL ASSESSMENT AND PLAN  Chest pain   Plan: The patient had presented for chest pain that started today. Patient's labs are unremarkable. Patient's imaging not reveal any acute process including CT angiogram.  Patient could have pericarditis, was seen by cardiology here who recommends outpatient follow-up on colchicine.  Patient agrees with this plan.   Ulice Dash, MD    Note: This note was generated in part or whole with voice recognition software. Voice recognition is usually quite accurate but there are transcription errors that can and very often do occur. I  apologize for any typographical errors that were not detected and corrected.     Emily Filbert, MD 05/22/19 [phone]

## 2019-05-22 NOTE — ED Notes (Signed)
Pt taken to CT.

## 2019-05-22 NOTE — ED Notes (Signed)
Cardiology at bedside.

## 2019-05-22 NOTE — ED Triage Notes (Signed)
Arrived by EMS from home for chest pain. EMS vitals 149/90, 80Hr, 97% ra. A7O x4 upon arrival

## 2019-05-22 NOTE — Progress Notes (Signed)
Rebecca Graham    [id number]   1955-09-22  CTA Chest  05/22/2019  During your examination at [facility] some of the x-ray dye leaked into the tissues around the vein that the x-ray dye was given through.  What should you do?  1. Apply ice 20 minutes four times a day for three days.  2. Keep the affected extremity elevated above the rest of your body for 48 hours.  3. If you develop any of the following signs or symptoms, please contact Radiology at [phone].  Increased pain  Increasing swelling   Change in sensation (ex. Numbness, tingling)  Development of redness or increase in warmth around the affected area  Increasing hardness  Blistering   I have read and understand these instructions.  Any questions I raised have been discussed to my satisfaction.  I understand that failure to follow these instructions may result in additional complications.

## 2019-05-22 NOTE — Consult Note (Signed)
Cardiology Consultation:   Patient ID: [name] MRN: [id number]; DOB: December 24, 1955  Admit date: 05/22/2019 Date of Consult: 05/22/2019  Primary Care Provider: Patient, No Pcp Per Primary Cardiologist: New - Ahliya Glatt Primary Electrophysiologist:  None    Patient Profile:   Rebecca Graham is a 64 y.o. female with a hx of hypertension (not on medications), seasonal allergies, GERD, and pneumonia who is being seen today for the evaluation of chest pain and abnormal EKG at the request of Dr. Mayford Knife.  History of Present Illness:   Rebecca Graham was in her usual state of health until she woke up around 4:30 AM with stabbing central chest pain as well as vague pressure across her chest.  Its maximal intensity was 8/10.  She tried taking Tylenol without relief and therefore presented to the ER.  She notes that the pain has been worsened by lying flat as well as taking a deep breath and coughing.  She notes a chronic cough with clear sputum production, which she attributes to seasonal allergies.  The pain has come and gone since arriving in the ER, with a current intensity of 5/10.  Rebecca Graham denies a history of prior cardiac disease.  She has not had any exertional chest pain or shortness of breath leading up to today's presentation.  She denies recent infections, but notes that her allergies seem to bother her year-round.  She has not had any fevers, chills, or known sick contacts.  Initial ED EKG was concerning for possible inferior and anterolateral ST segment elevation.  Heart Pathway Score:     Past Medical History:  Diagnosis Date  . GERD (gastroesophageal reflux disease)   . Hypertension   . Pneumonia     Past Surgical History:  Procedure Laterality Date  . TUBAL LIGATION  1987     Home Medications:  Prior to Admission medications   Medication Sig Start Date Prachi Oftedahl Date Taking? Authorizing Provider  acetaminophen (TYLENOL) 500 MG tablet Take 500 mg by mouth every 6 (six) hours as  needed.    [provider]  colchicine 0.6 MG tablet Take 1 tablet (0.6 mg total) by mouth 2 (two) times daily. 05/22/19 05/21/20  Emily Filbert, MD  fluconazole (DIFLUCAN) 150 MG tablet Take 1 tablet (150 mg total) by mouth daily. 02/06/19   Emily Filbert, MD  metroNIDAZOLE (FLAGYL) 500 MG tablet Take 1 tablet (500 mg total) by mouth 2 (two) times daily. 02/06/19   Emily Filbert, MD  oxyCODONE-acetaminophen (PERCOCET/ROXICET) 5-325 MG tablet Take 1 tablet by mouth every 4 (four) hours as needed for severe pain. 08/15/17   Fisher, Roselyn Bering, PA-C  sulfamethoxazole-trimethoprim (BACTRIM DS) 800-160 MG tablet Take 1 tablet by mouth 2 (two) times daily. 02/06/19   Emily Filbert, MD    PRN Meds: nitroGLYCERIN  Allergies:    Allergies  Allergen Reactions  . Aspirin Rash  . Penicillins Rash    Social History:   Social History   Tobacco Use  . Smoking status: Current Every Day Smoker    Packs/day: 1.00    Years: 45.00    Pack years: 45.00    Types: Cigarettes  . Smokeless tobacco: Never Used  Substance Use Topics  . Alcohol use: Yes    Alcohol/week: 12.0 standard drinks    Types: 12 Cans of beer per week  . Drug use: No     Family History:   Family History  Problem Relation Age of Onset  . Multiple sclerosis Mother   .  Colon cancer Father   . Lung cancer Sister   . Hypertension Daughter      ROS:  Please see the history of present illness. All other ROS reviewed and negative.     Physical Exam/Data:   Vitals:   05/22/19 1100 05/22/19 1230 05/22/19 1330 05/22/19 1400  BP: 126/85 140/85 (!) 140/91 (!) 143/92  Pulse: 82 81 88 88  Resp: (!) 22 17 (!) 23 17  SpO2: 99% 100% 98% 100%  Weight:      Height:       No intake or output data in the 24 hours ending 05/22/19 1426 Last 3 Weights 05/22/2019 02/06/2019 08/15/2017  Weight (lbs) 126 lb 135 lb 147 lb  Weight (kg) 57.153 kg 61.236 kg 66.679 kg     Body mass index is 20.34 kg/m.  General:   Well nourished, well developed, in no acute distress HEENT: normal Lymph: no adenopathy Neck: no JVD Endocrine:  No thryomegaly Vascular: No carotid bruits; 2+ radial and pedal pulses bilaterally. Cardiac: Regular rate and rhythm without murmurs, rubs, or gallops. Lungs:  clear to auscultation bilaterally, no wheezing, rhonchi or rales  Abd: soft, nontender, no hepatomegaly  Ext: no edema Musculoskeletal:  No deformities, BUE and BLE strength normal and equal Skin: warm and dry  Neuro:  CNs 2-12 intact, no focal abnormalities noted Psych:  Normal affect   EKG:  The EKG performed at 12:21 PM was personally reviewed and demonstrates: Normal sinus rhythm with borderline LVH and nonspecific ST segment changes.  There is subtle ST elevation involving the inferior and anterolateral leads.  Question subtle PR depression in the inferior and lateral leads as well as PR elevation in aVR. Telemetry:  Telemetry was personally reviewed and demonstrates: Normal sinus rhythm  Relevant CV Studies: None  Laboratory Data:  High Sensitivity Troponin:   Recent Labs  Lab 05/22/19 0922 05/22/19 1148  TROPONINIHS 5 4     Chemistry Recent Labs  Lab 05/22/19 0922  NA 141  K 3.9  CL 106  CO2 25  GLUCOSE 130*  BUN 7*  CREATININE 0.51  CALCIUM 8.9  GFRNONAA >60  GFRAA >60  ANIONGAP 10    No results for input(s): PROT, ALBUMIN, AST, ALT, ALKPHOS, BILITOT in the last 168 hours. Hematology Recent Labs  Lab 05/22/19 0922  WBC 6.6  RBC 4.52  HGB 14.4  HCT 44.1  MCV 97.6  MCH 31.9  MCHC 32.7  RDW 15.5  PLT 97*   BNPNo results for input(s): BNP, PROBNP in the last 168 hours.  DDimer No results for input(s): DDIMER in the last 168 hours.   Radiology/Studies:  CT Angio Chest PE W and/or Wo Contrast  Result Date: 05/22/2019 CLINICAL DATA:  Chest pain and shortness of breath. EXAM: CT ANGIOGRAPHY CHEST WITH CONTRAST TECHNIQUE: Multidetector CT imaging of the chest was performed using the  standard protocol during bolus administration of intravenous contrast. Multiplanar CT image reconstructions and MIPs were obtained to evaluate the vascular anatomy. CONTRAST:  60 mL OMNIPAQUE IOHEXOL 350 MG/ML SOLN COMPARISON:  Single-view of the chest today and PA and lateral chest 10/31/2014. FINDINGS: Cardiovascular: Satisfactory opacification of the pulmonary arteries to the segmental level. No evidence of pulmonary embolism. Normal heart size. No pericardial effusion. Few scattered aortic atherosclerotic calcifications are seen. Mediastinum/Nodes: No enlarged mediastinal, hilar, or axillary lymph nodes. Thyroid gland, trachea, and esophagus demonstrate no significant findings. Lungs/Pleura: Lungs are clear. No pleural effusion or pneumothorax. Centrilobular emphysema is identified. Upper Abdomen: Negative. Musculoskeletal:  No acute or focal abnormality. Review of the MIP images confirms the above findings. IMPRESSION: Negative for pulmonary embolus.  No acute disease. Aortic Atherosclerosis (ICD10-I70.0) and Emphysema (ICD10-J43.9). Electronically Signed   By: Inge Rise M.D.   On: 05/22/2019 11:50   DG Chest Port 1 View  Result Date: 05/22/2019 CLINICAL DATA:  Chest pain EXAM: PORTABLE CHEST 1 VIEW COMPARISON:  11/20/2014 FINDINGS: There is no focal consolidation. There is no pleural effusion or pneumothorax. The heart and mediastinal contours are unremarkable. There is no acute osseous abnormality. There are old right rib fractures. IMPRESSION: No active disease. Electronically Signed   By: Kathreen Devoid   On: 05/22/2019 09:40    Assessment and Plan:   Atypical chest pain with suspected pericarditis: Pain is atypical and seems to be positional, predominately pleuritic in nature.  EKG shows some subtle ST segment elevation in multiple coronary distributions.  Additionally, most recent EKG does not clearly meet STEMI criteria.  Troponin was negative x2.  CTA of the chest was negative for PE as  well as other acute process.  I personally reviewed the CTA and see no significant coronary calcification either.  I suspect that symptoms are most likely related to pericarditis.  I recommend empiric treatment with colchicine 0.6 mg twice daily.  NSAIDs should be deferred, given the patient's report of itching with aspirin and other NSAIDs.  We will arrange for an outpatient echocardiogram and follow-up with me or an APP in the office in 2 to 3 weeks.  I think it is reasonable for Rebecca Graham to be discharged home from the emergency department.  [facility] will sign off.   Medication Recommendations: Colchicine 0.6 mg twice daily Other recommendations (labs, testing, etc): Outpatient echocardiogram Follow up as an outpatient: 2 to 3 weeks.  For questions or updates, please contact [place] Please consult [email or web address] for contact info under [facility] Cardiology.  Signed, Nelva Bush, MD  05/22/2019 2:26 PM

## 2019-05-23 ENCOUNTER — Telehealth: Payer: Self-pay | Admitting: *Deleted

## 2019-05-23 DIAGNOSIS — R079 Chest pain, unspecified: Secondary | ICD-10-CM

## 2019-05-23 DIAGNOSIS — I319 Disease of pericardium, unspecified: Secondary | ICD-10-CM

## 2019-05-23 NOTE — Telephone Encounter (Signed)
Routing to scheduling to arrange. She has appointment already but it may need to be changed to accommodate for the echo prior to.  Please let me know if you need any help.

## 2019-05-23 NOTE — Telephone Encounter (Signed)
-----   Message from Yvonne Kendall, MD sent at 05/22/2019  2:26 PM EST ----- Regarding: ED follow-up Hello,  Could you arrange for Ms. Theurer to be seen by me or an APP in 2-3 weeks.  She should have an echo shortly before the follow-up visit for assessment of chest pain, suspected pericarditis.  Thanks.  Thayer Ohm

## 2019-05-23 NOTE — Progress Notes (Deleted)
    Office Visit    Patient Name: Rebecca Graham Date of Encounter: 05/23/2019  Primary Care Provider:  Patient, No Pcp Per Primary Cardiologist:  Yvonne Kendall, MD Electrophysiologist:  None   Chief Complaint    Rebecca Graham is a 64 y.o. female with a hx of HTN, allergic rhinitis, GERD, PNA presents today for follow up after ED visit   Past Medical History    Past Medical History:  Diagnosis Date  . GERD (gastroesophageal reflux disease)   . Hypertension   . Pneumonia    Past Surgical History:  Procedure Laterality Date  . TUBAL LIGATION  1987    Allergies  Allergies  Allergen Reactions  . Aspirin Rash  . Penicillins Rash    History of Present Illness    Rebecca Graham is a 64 y.o. female with a hx of HTN not on medication, allergic rhinitis, GERD, PNA last seen by Dr. Okey Dupre while in the ED 05/22/19.  Seen in the ED 05/22/19 for cheset pain. Pain was worsened by lying flat and taking a deep breath. She reports chronic cough with clear sputum which she attributes to allergies. Initial ED EKG concerning for possible inferior and anterolateral ST segment elevation. Troponin negative x2.  CT angio with no PE and on review by Dr. Okey Dupre, no significant coronary calcification. CXR no acute findings. Diagnosed with pericarditis and started on colchicine 0.6mg  BID. NSAID deferred due to patient report of itching with aspirin and other NSAIDS.  EKGs/Labs/Other Studies Reviewed:   The following studies were reviewed today: ***  EKG:  EKG is ordered today.  The ekg ordered today demonstrates ***  Recent Labs: 05/22/2019: BUN 7; Creatinine, Ser 0.51; Hemoglobin 14.4; Platelets 97; Potassium 3.9; Sodium 141  Recent Lipid Panel No results found for: CHOL, TRIG, HDL, CHOLHDL, VLDL, LDLCALC, LDLDIRECT  Home Medications   No outpatient medications have been marked as taking for the 05/28/19 encounter (Appointment) with Alver Sorrow, NP.      Review of Systems    ***     ROS All other systems reviewed and are otherwise negative except as noted above.  Physical Exam    VS:  There were no vitals taken for this visit. , BMI There is no height or weight on file to calculate BMI. GEN: Well nourished, well developed, in no acute distress. HEENT: normal. Neck: Supple, no JVD, carotid bruits, or masses. Cardiac: ***RRR, no murmurs, rubs, or gallops. No clubbing, cyanosis, edema.  ***Radials/DP/PT 2+ and equal bilaterally.  Respiratory:  ***Respirations regular and unlabored, clear to auscultation bilaterally. GI: Soft, nontender, nondistended, BS + x 4. MS: No deformity or atrophy. Skin: Warm and dry, no rash. Neuro:  Strength and sensation are intact. Psych: Normal affect.  Accessory Clinical Findings    ECG personally reviewed by me today - *** - no acute changes.  Assessment & Plan    1. Pericarditis - Plan for echocardiogram.  2. Chest pain -  3. HTN -  4. GERD -   Disposition: Follow up {follow up:15908} with ***   Alver Sorrow, NP 05/23/2019, 7:38 AM

## 2019-05-28 ENCOUNTER — Ambulatory Visit: Payer: PRIVATE HEALTH INSURANCE | Admitting: Family

## 2019-06-03 ENCOUNTER — Ambulatory Visit: Payer: PRIVATE HEALTH INSURANCE | Attending: Internal Medicine

## 2019-06-03 DIAGNOSIS — Z23 Encounter for immunization: Secondary | ICD-10-CM | POA: Insufficient documentation

## 2019-06-03 NOTE — Progress Notes (Signed)
   Covid-19 Vaccination Clinic  Name:  [name]    MRN: [id number] DOB: 1956-03-29  06/03/2019  [name] was observed post Covid-19 immunization for 15 minutes without incidence. She was provided with Vaccine Information Sheet and instruction to access the V-Safe system.   [name] was instructed to call 911 with any severe reactions post vaccine: Marland Kitchen Difficulty breathing  . Swelling of your face and throat  . A fast heartbeat  . A bad rash all over your body  . Dizziness and weakness    Immunizations Administered    Name Date Dose VIS Date Route   Pfizer COVID-19 Vaccine 06/03/2019 11:09 AM 0.3 mL 03/30/2019 Intramuscular   Manufacturer: [employer], [employer]   Lot: [id number]   NDC: [address]-4

## 2019-06-04 NOTE — Telephone Encounter (Signed)
Scheduled

## 2019-06-05 ENCOUNTER — Ambulatory Visit (INDEPENDENT_AMBULATORY_CARE_PROVIDER_SITE_OTHER): Payer: Medicare Other

## 2019-06-05 ENCOUNTER — Other Ambulatory Visit: Payer: Self-pay

## 2019-06-05 DIAGNOSIS — I319 Disease of pericardium, unspecified: Secondary | ICD-10-CM

## 2019-06-05 DIAGNOSIS — R079 Chest pain, unspecified: Secondary | ICD-10-CM

## 2019-06-05 DIAGNOSIS — I340 Nonrheumatic mitral (valve) insufficiency: Secondary | ICD-10-CM

## 2019-06-05 HISTORY — DX: Nonrheumatic mitral (valve) insufficiency: I34.0

## 2019-06-08 ENCOUNTER — Ambulatory Visit (INDEPENDENT_AMBULATORY_CARE_PROVIDER_SITE_OTHER): Payer: Medicare Other | Admitting: Family

## 2019-06-08 ENCOUNTER — Other Ambulatory Visit: Payer: Self-pay

## 2019-06-08 ENCOUNTER — Encounter: Payer: Self-pay | Admitting: Family

## 2019-06-08 VITALS — BP 132/82 | HR 63 | Ht 67.0 in | Wt 137.5 lb

## 2019-06-08 DIAGNOSIS — I300 Acute nonspecific idiopathic pericarditis: Secondary | ICD-10-CM | POA: Diagnosis not present

## 2019-06-08 DIAGNOSIS — I10 Essential (primary) hypertension: Secondary | ICD-10-CM

## 2019-06-08 DIAGNOSIS — Z720 Tobacco use: Secondary | ICD-10-CM | POA: Diagnosis not present

## 2019-06-08 DIAGNOSIS — I340 Nonrheumatic mitral (valve) insufficiency: Secondary | ICD-10-CM

## 2019-06-08 NOTE — Patient Instructions (Addendum)
Medication Instructions:  No medication changes.  Continue your colchicine for 3 months from start date of 05/22/19.  *If you need a refill on your cardiac medications before your next appointment, please call your pharmacy*  Lab Work: No lab work today. Would recommend having cholesterol level checked with your primary care provider.  Testing/Procedures: You had an EKG today. It showed normal sinus rhythm.  Follow-Up: At [facility], you and your health needs are our priority.  As part of our continuing mission to provide you with exceptional heart care, we have created designated Provider Care Teams.  These Care Teams include your primary Cardiologist (physician) and Advanced Practice Providers (APPs -  Physician Assistants and Nurse Practitioners) who all work together to provide you with the care you need, when you need it.  Your next appointment:   2 month(s)  The format for your next appointment:   In Person  Provider:    You may see Nelva Bush, MD or one of the following Advanced Practice Providers on your designated Care Team:    Murray Hodgkins, NP  Christell Faith, PA-C  Marrianne Mood, PA-C   Other Instructions  Your CT scan in the ED showed centrilobular emphysema. Since you have no symptoms such as shortness of breath would recommend quitting smoking to prevent further progression. I like 1-800-QUITNOW as a resource.  Your echocardiogram showed a normal pumping function which is great! It showed your mitral valve was moderately leaky. We will manage this by keeping your blood pressure well controlled. Recommend repeating an echocardiogram in 1 year for monitoring.   Recommend checking your blood pressure once per week and keeping a log. Our goal would be for a blood pressure of less than 130/80.   Pericarditis  Pericarditis is inflammation of the pericardium. The pericardium is a thin, double-layered, fluid-filled sac that surrounds your heart. The  pericardium protects and holds your heart in your chest cavity. Inflammation of your pericardium can cause rubbing (friction) between the two layers when your heart beats. Fluid may also build up between the layers of the sac (pericardial effusion). There are three types of this condition:  Acute pericarditis. Inflammation develops suddenly and causes pericardial effusion.  Chronic pericarditis. Inflammation may develop slowly, or it may continue after acute pericarditis and last longer than 6 months.  Constrictive pericarditis (rare). The layers of the pericardium stiffen and develop scar tissue. The scar tissue thickens and sticks together. This makes it difficult for the heart to work in a normal way. In most cases, pericarditis is acute and not serious. Chronic pericarditis and constrictive pericarditis are more serious and may require treatment. What are the causes? In most cases, the cause of this condition is not known. If a cause is found, it may be:  An infection from a virus.  A heart attack (myocardial infarction).  Problems after open-heart surgery.  Chest injury.  Autoimmune disorder. The body's defense system (immune system) attacks healthy tissues. Examples include lupus or rheumatoid arthritis.  Kidney failure.  Underactive thyroid gland (hypothyroidism).  Cancer that has spread (metastasized) to the pericardium from another part of the body.  Treatment using high-energy X-rays (radiation).  Certain medicines, including some seizure medicines, blood thinners, heart medicines, and antibiotics.  Infection from a fungus or bacteria (rare). What increases the risk? The following factors may make you more likely to develop this condition:  Being female.  Being 24-48 years old.  Having had pericarditis before.  Having had a recent upper respiratory tract  infection. What are the signs or symptoms? The main symptom of this condition is chest pain. The chest pain  may:  Be in the center or the left side of your chest.  Not go away with rest.  Last for many hours or days.  Worsen when you lie down and get better when you sit up and lean forward.  Worsen when you swallow.  Move to your back, neck, or shoulder. Other symptoms may include:  A chronic, dry cough.  Heart palpitations. These may feel like rapid, fluttering, or pounding heartbeats.  Dizziness or fainting.  Tiredness or fatigue.  Fever.  Rapid breathing.  Shortness of breath when lying down. How is this diagnosed? This condition is diagnosed based on medical history, physical exam, and diagnostic tests. During your physical exam, your health care provider will listen for friction while your heart beats (pericardial rub). You may also have tests, including:  Blood tests to look for signs of infection and inflammation.  Electrocardiogram (ECG). This test measures the electrical activity in your heart.  Echocardiogram. This uses sound waves to make images of your heart.  CT scan.  MRI.  Culture of pericardial fluid.  Removing a tissue sample of the pericardium to look at under a microscope (biopsy). If tests show that you may have constrictive pericarditis, a small, thin tube may be inserted into your heart to confirm the diagnosis (cardiac catheterization). How is this treated? Treatment for this condition depends on the cause and type of pericarditis that you have. In most cases, acute pericarditis will clear up on its own. Treatment may include:  Limiting physical activity.  Medicines, such as: ? NSAIDs to relieve pain and inflammation. ? Steroids to reduce inflammation. ? Colchicine to relieve pain and inflammation.  A procedure to remove fluid using a needle (pericardiocentesis) if fluid buildup puts pressure on the heart.  Surgery to remove part of the pericardium if constrictive pericarditis develops. If another condition is causing your pericarditis, you  may also need treatment for that condition. Follow these instructions at home:  Limit your activity as told by your health care provider until your symptoms improve. This usually includes: ? Resting or sitting for most of the day. ? No long walks. ? No exercise. ? No sports.  Athletes may need to limit competition for several months.  Do not use any products that contain nicotine or tobacco, such as cigarettes and e-cigarettes. If you need help quitting, ask your health care provider.  Eat a heart-healthy diet. Ask your dietitian what foods are healthy for your heart.  Take over-the-counter and prescription medicines only as told by your health care provider. ? If you were prescribed an antibiotic medicine, take it as told by your health care provider. Do not stop taking the antibiotic even if you start to feel better.  Keep all follow-up visits as told by your health care provider. This is important. Contact a health care provider if:  You continue to have symptoms of pericarditis.  You develop new symptoms of pericarditis.  Your symptoms get worse.  You have a fever. Get help right away if:  You have worsening chest pain.  You have difficulty breathing.  You pass out. These symptoms may represent a serious problem that is an emergency. Do not wait to see if the symptoms will go away. Get medical help right away. Call your local emergency services (911 in the [place] Do not drive yourself to the hospital. Summary  Pericarditis is inflammation of  the pericardium.  The pericardium is a thin, double-layered, fluid-filled sac that surrounds your heart.  The main symptom of this condition is chest pain.  Treatment for this condition depends on the cause and type of pericarditis that you have. This information is not intended to replace advice given to you by your health care provider. Make sure you discuss any questions you have with your health care provider. Document  Revised: 05/12/2017 Document Reviewed: 05/12/2017 Elsevier Patient Education  2020 [employer].

## 2019-06-08 NOTE — Progress Notes (Signed)
Office Visit    Patient Name: Rebecca Graham Date of Encounter: 06/08/2019  Primary Care Provider:  Enid Baas, MD Primary Cardiologist:  Yvonne Kendall, MD Electrophysiologist:  None   Chief Complaint    Cathyrn A Dettmer is a 64 y.o. female with a hx of pericarditis, HTN (not on medications), OSA, GERD, pneumonia, overactive bladder presents today for follow up after ED visit and pericarditis and echocardiogram.   Past Medical History    Past Medical History:  Diagnosis Date  . GERD (gastroesophageal reflux disease)   . Hypertension   . Moderate mitral regurgitation 06/05/2019   (a) Echo 06/05/19 normal LVEF with moderate MR  . Pericarditis 05/22/2019   (a) treated with colchicine. no NSAID due to intolerance  . Pneumonia    Past Surgical History:  Procedure Laterality Date  . TUBAL LIGATION  1987    Allergies  Allergies  Allergen Reactions  . Aspirin Rash  . Penicillins Rash    History of Present Illness    Rebecca Graham is a 64 y.o. female with a hx of pericarditis, HTN (not on medications), OSA, GERD, pneumonia, overactive bladder last seen in the ED 05/22/19.   Seen in the ED 05/22/19 for cheset pain. Pain was worsened by lying flat and taking a deep breath. She reports chronic cough with clear sputum which she attributes to allergies. Initial ED EKG concerning for possible inferior and anterolateral ST segment elevation. Troponin negative x2.  CT angio with no PE and on review by Dr. Okey Dupre, no significant coronary calcification. CXR no acute findings. Diagnosed with pericarditis and started on colchicine 0.6mg  BID. NSAID deferred due to patient report of itching with aspirin and other NSAIDS.  Echo 06/05/19 with LVEF 55-60%, normal LV diastolic function, RV normal size and function, moderate MR.  Present today with her daughter. Her other daughter was on speaker phone for the visit.  Reports no chest pain, pressure, tightness. Reports no shortness  of breath nor dyspnea on exertion. She is tolerating colchicine well with no diarrhea.   Tells me she smokes a pack per day and is not interested in quitting. We discussed quitting via cutting back or using tools such as a nicotine patch. Recommended using 1-800-QUITNOW.   She has an appointment Monday with her new primary care provider Dr. Nemiah Commander.   EKGs/Labs/Other Studies Reviewed:   The following studies were reviewed today:  Echo 06/05/19  1. Left ventricular ejection fraction, by estimation, is 55 to 60%. The  left ventricle has normal function. The left ventricle has no regional  wall motion abnormalities. Left ventricular diastolic parameters were  normal.   2. Right ventricular systolic function is normal. The right ventricular  size is normal. Tricuspid regurgitation signal is inadequate for assessing  PA pressure.   3. The mitral valve is degenerative. Moderate mitral valve regurgitation.  No evidence of mitral stenosis.   4. The aortic valve is tricuspid. Aortic valve regurgitation is not  visualized. No aortic stenosis is present.   5. The inferior vena cava is normal in size with greater than 50%  respiratory variability, suggesting right atrial pressure of 3 mmHg.   CT Angio Chest PE W and/or Wo Contrast   Result Date: 05/22/2019 CLINICAL DATA:  Chest pain and shortness of breath. EXAM: CT ANGIOGRAPHY CHEST WITH CONTRAST TECHNIQUE: Multidetector CT imaging of the chest was performed using the standard protocol during bolus administration of intravenous contrast. Multiplanar CT image reconstructions and MIPs were obtained to evaluate  the vascular anatomy. CONTRAST:  60 mL OMNIPAQUE IOHEXOL 350 MG/ML SOLN COMPARISON:  Single-view of the chest today and PA and lateral chest 10/31/2014. FINDINGS: Cardiovascular: Satisfactory opacification of the pulmonary arteries to the segmental level. No evidence of pulmonary embolism. Normal heart size. No pericardial effusion. Few scattered  aortic atherosclerotic calcifications are seen. Mediastinum/Nodes: No enlarged mediastinal, hilar, or axillary lymph nodes. Thyroid gland, trachea, and esophagus demonstrate no significant findings. Lungs/Pleura: Lungs are clear. No pleural effusion or pneumothorax. Centrilobular emphysema is identified. Upper Abdomen: Negative. Musculoskeletal: No acute or focal abnormality. Review of the MIP images confirms the above findings. IMPRESSION: Negative for pulmonary embolus.  No acute disease. Aortic Atherosclerosis (ICD10-I70.0) and Emphysema (ICD10-J43.9). Electronically Signed   By: Drusilla Kanner M.D.   On: 05/22/2019 11:50    DG Chest Port 1 View   Result Date: 05/22/2019 CLINICAL DATA:  Chest pain EXAM: PORTABLE CHEST 1 VIEW COMPARISON:  11/20/2014 FINDINGS: There is no focal consolidation. There is no pleural effusion or pneumothorax. The heart and mediastinal contours are unremarkable. There is no acute osseous abnormality. There are old right rib fractures. IMPRESSION: No active disease. Electronically Signed   By: Elige Ko   On: 05/22/2019 09:40   EKG:  EKG is ordered today.  The ekg ordered today demonstrates SR 63 bpm with no acute ST/T wave changes.  Recent Labs: 05/22/2019: BUN 7; Creatinine, Ser 0.51; Hemoglobin 14.4; Platelets 97; Potassium 3.9; Sodium 141  Recent Lipid Panel No results found for: CHOL, TRIG, HDL, CHOLHDL, VLDL, LDLCALC, LDLDIRECT  Home Medications   Current Meds  Medication Sig  . acetaminophen (TYLENOL) 500 MG tablet Take 500 mg by mouth every 6 (six) hours as needed.  . colchicine 0.6 MG tablet Take 1 tablet (0.6 mg total) by mouth 2 (two) times daily.  . fluconazole (DIFLUCAN) 150 MG tablet Take 1 tablet (150 mg total) by mouth daily.  . metroNIDAZOLE (FLAGYL) 500 MG tablet Take 1 tablet (500 mg total) by mouth 2 (two) times daily.    Review of Systems     Review of Systems  Constitution: Negative for chills, fever and malaise/fatigue.  Cardiovascular:  Negative for chest pain, dyspnea on exertion, leg swelling, near-syncope, orthopnea, palpitations and syncope.  Respiratory: Negative for cough, shortness of breath and wheezing.   Gastrointestinal: Negative for nausea and vomiting.  Neurological: Negative for dizziness, light-headedness and weakness.   All other systems reviewed and are otherwise negative except as noted above.  Physical Exam    VS:  BP 132/82 (BP Location: Left Arm, Patient Position: Sitting, Cuff Size: Normal)   Pulse 63   Ht 5\' 7"  (1.702 m)   Wt 137 lb 8 oz (62.4 kg)   SpO2 99%   BMI 21.54 kg/m  , BMI Body mass index is 21.54 kg/m. GEN: Well nourished, well developed, in no acute distress. HEENT: normal. Neck: Supple, no JVD, carotid bruits, or masses. Cardiac: RRR, no murmurs, rubs, or gallops. No clubbing, cyanosis, edema.  Radials/DP/PT 2+ and equal bilaterally.  Respiratory:  Respirations regular and unlabored, clear to auscultation bilaterally. GI: Soft, nontender, nondistended, BS + x 4. MS: No deformity or atrophy. Skin: Warm and dry, no rash. Neuro:  Strength and sensation are intact. Psych: Normal affect.  Accessory Clinical Findings    ECG personally reviewed by me today - SR 63 bpm - no acute changes.  Assessment & Plan    1. Pericarditis - No recurrent chest pain. EKG today SR 63 bpm with no ST/T  wave changes. Continue colchicine to complete 3 months of therapy. No NSAID in setting of history of intolerance.   2. HTN - Not presently on medications. BP mildly elevated 132/82 today. She is agreeable to check her BP at home and will report BP consistently >130/80. May require antihypertensive therapy. She will follow up with PCP at upcoming office visit Monday.   3. Tobacco abuse - CT 05/2019 with centrilobular emphysema, likely due to longstanding history of tobacco abuse. Not presently interested in smoking. Smoking cessation encouraged. Recommend utilization of 1800QUITNOW. Would recommend  monitoring of lipid panel with PCP.  4. Moderate mitral regurgitation - Noted on echo 06/08/19 with normal LVEF. Discussed importance of optimal blood pressure control. She was educated to report new shortness of breath, edema. Plan for echocardiogram in 1 year for monitoring.   Disposition: Follow up in 2 month(s) with Dr. Saunders Revel or APP   Loel Dubonnet, NP 06/08/2019, 1:41 PM

## 2019-06-11 ENCOUNTER — Other Ambulatory Visit: Payer: Self-pay | Admitting: Internal Medicine

## 2019-06-11 DIAGNOSIS — Z1231 Encounter for screening mammogram for malignant neoplasm of breast: Secondary | ICD-10-CM

## 2019-06-16 ENCOUNTER — Telehealth: Payer: Self-pay | Admitting: *Deleted

## 2019-06-16 NOTE — Telephone Encounter (Signed)
Received referral for low dose lung cancer screening CT scan. Message left at phone number listed in EMR for patient to call me back to facilitate scheduling scan.  

## 2019-07-03 ENCOUNTER — Ambulatory Visit: Payer: PRIVATE HEALTH INSURANCE | Attending: Internal Medicine

## 2019-07-03 DIAGNOSIS — Z23 Encounter for immunization: Secondary | ICD-10-CM

## 2019-07-03 NOTE — Progress Notes (Signed)
   Covid-19 Vaccination Clinic  Name:  Rebecca Graham    MRN: [id number] DOB: July 10, 1955  07/03/2019  Rebecca Graham was observed post Covid-19 immunization for 15 minutes without incident. She was provided with Vaccine Information Sheet and instruction to access the V-Safe system.   Rebecca Graham was instructed to call 911 with any severe reactions post vaccine: Marland Kitchen Difficulty breathing  . Swelling of face and throat  . A fast heartbeat  . A bad rash all over body  . Dizziness and weakness   Immunizations Administered    Name Date Dose VIS Date Route   Pfizer COVID-19 Vaccine 07/03/2019 12:51 PM 0.3 mL 03/30/2019 Intramuscular   Manufacturer: [employer], [employer]   Lot: [id number]   NDC: [address]-5

## 2019-07-24 ENCOUNTER — Telehealth: Payer: Self-pay | Admitting: *Deleted

## 2019-07-24 DIAGNOSIS — Z87891 Personal history of nicotine dependence: Secondary | ICD-10-CM

## 2019-07-24 NOTE — Telephone Encounter (Signed)
Received referral for initial lung cancer screening scan. Contacted patient and obtained smoking history,(current, 45 pack year) as well as answering questions related to screening process. Patient denies signs of lung cancer such as weight loss or hemoptysis. Patient denies comorbidity that would prevent curative treatment if lung cancer were found. Patient is scheduled for shared decision making visit and CT scan on 08/08/19 at 1045am.

## 2019-08-08 ENCOUNTER — Inpatient Hospital Stay: Payer: Medicare Other | Attending: Oncology | Admitting: Hospice and Palliative Medicine

## 2019-08-08 ENCOUNTER — Ambulatory Visit
Admission: RE | Admit: 2019-08-08 | Discharge: 2019-08-08 | Disposition: A | Discharge status: Home / Self Care | Payer: Medicare Other | Source: Ambulatory Visit | Attending: Oncology | Admitting: Oncology

## 2019-08-08 ENCOUNTER — Other Ambulatory Visit: Payer: Self-pay

## 2019-08-08 DIAGNOSIS — Z87891 Personal history of nicotine dependence: Secondary | ICD-10-CM

## 2019-08-08 NOTE — Progress Notes (Signed)
In accordance with CMS guidelines, patient has met eligibility criteria including age, absence of signs or symptoms of lung cancer.  Social History   Tobacco Use  . Smoking status: Current Every Day Smoker    Packs/day: 1.00    Years: 45.00    Pack years: 45.00    Types: Cigarettes  . Smokeless tobacco: Never Used  Substance Use Topics  . Alcohol use: Yes    Alcohol/week: 12.0 standard drinks    Types: 12 Cans of beer per week  . Drug use: No      A shared decision-making session was conducted prior to the performance of CT scan. This includes one or more decision aids, includes benefits and harms of screening, follow-up diagnostic testing, over-diagnosis, false positive rate, and total radiation exposure.   Counseling on the importance of adherence to annual lung cancer LDCT screening, impact of co-morbidities, and ability or willingness to undergo diagnosis and treatment is imperative for compliance of the program.   Counseling on the importance of continued smoking cessation for former smokers; the importance of smoking cessation for current smokers, and information about tobacco cessation interventions have been given to patient including [place] and 1800 quit [place] programs.   Written order for lung cancer screening with LDCT has been given to the patient and any and all questions have been answered to the best of my abilities.    Yearly follow up will be coordinated by Burgess Estelle, Thoracic Navigator.  Time Total: 15 minutes  Visit consisted of counseling and education dealing with complex health screening. Greater than 50%  of this time was spent counseling and coordinating care related to the above assessment and plan.  Signed by: Altha Harm, PhD, NP-C [phone] (Work Cell)

## 2019-08-10 ENCOUNTER — Other Ambulatory Visit: Payer: Self-pay

## 2019-08-10 ENCOUNTER — Encounter: Payer: Self-pay | Admitting: Internal Medicine

## 2019-08-10 ENCOUNTER — Ambulatory Visit (INDEPENDENT_AMBULATORY_CARE_PROVIDER_SITE_OTHER): Payer: Medicare Other | Admitting: Internal Medicine

## 2019-08-10 VITALS — BP 110/66 | HR 62 | Ht 66.0 in | Wt 134.2 lb

## 2019-08-10 DIAGNOSIS — Z720 Tobacco use: Secondary | ICD-10-CM | POA: Diagnosis not present

## 2019-08-10 DIAGNOSIS — I300 Acute nonspecific idiopathic pericarditis: Secondary | ICD-10-CM

## 2019-08-10 DIAGNOSIS — I340 Nonrheumatic mitral (valve) insufficiency: Secondary | ICD-10-CM

## 2019-08-10 NOTE — Patient Instructions (Signed)
Medication Instructions:  Your physician has recommended you make the following change in your medication:  STOP Colchicine.  *If you need a refill on your cardiac medications before your next appointment, please call your pharmacy*   Lab Work: none If you have labs (blood work) drawn today and your tests are completely normal, you will receive your results only by: Marland Kitchen MyChart Message (if you have MyChart) OR . A paper copy in the mail If you have any lab test that is abnormal or we need to change your treatment, we will call you to review the results.   Testing/Procedures: Your physician has requested that you have an echocardiogram in one year prior to one year follow up appointment. Echocardiography is a painless test that uses sound waves to create images of your heart. It provides your doctor with information about the size and shape of your heart and how well your heart's chambers and valves are working. This procedure takes approximately one hour. There are no restrictions for this procedure. You may get an IV, if needed, to receive an ultrasound enhancing agent through to better visualize your heart.     Follow-Up: At [facility], you and your health needs are our priority.  As part of our continuing mission to provide you with exceptional heart care, we have created designated Provider Care Teams.  These Care Teams include your primary Cardiologist (physician) and Advanced Practice Providers (APPs -  Physician Assistants and Nurse Practitioners) who all work together to provide you with the care you need, when you need it.  We recommend signing up for the patient portal called "MyChart".  Sign up information is provided on this After Visit Summary.  MyChart is used to connect with patients for Virtual Visits (Telemedicine).  Patients are able to view lab/test results, encounter notes, upcoming appointments, etc.  Non-urgent messages can be sent to your provider as well.   To learn  more about what you can do with MyChart, go to [email or web address].    Your next appointment:   12 month(s) - make sure echo has been performed prior to appointment.   The format for your next appointment:   In Person  Provider:    You may see Yvonne Kendall, MD or one of the following Advanced Practice Providers on your designated Care Team:    Nicolasa Ducking, NP  Eula Listen, PA-C  Marisue Ivan, PA-C    Echocardiogram An echocardiogram is a procedure that uses painless sound waves (ultrasound) to produce an image of the heart. Images from an echocardiogram can provide important information about:  Signs of coronary artery disease (CAD).  Aneurysm detection. An aneurysm is a weak or damaged part of an artery wall that bulges out from the normal force of blood pumping through the body.  Heart size and shape. Changes in the size or shape of the heart can be associated with certain conditions, including heart failure, aneurysm, and CAD.  Heart muscle function.  Heart valve function.  Signs of a past heart attack.  Fluid buildup around the heart.  Thickening of the heart muscle.  A tumor or infectious growth around the heart valves. Tell a health care provider about:  Any allergies you have.  All medicines you are taking, including vitamins, herbs, eye drops, creams, and over-the-counter medicines.  Any blood disorders you have.  Any surgeries you have had.  Any medical conditions you have.  Whether you are pregnant or may be pregnant. What are the risks? Generally, this  is a safe procedure. However, problems may occur, including:  Allergic reaction to dye (contrast) that may be used during the procedure. What happens before the procedure? No specific preparation is needed. You may eat and drink normally. What happens during the procedure?   An IV tube may be inserted into one of your veins.  You may receive contrast through this tube. A contrast  is an injection that improves the quality of the pictures from your heart.  A gel will be applied to your chest.  A wand-like tool (transducer) will be moved over your chest. The gel will help to transmit the sound waves from the transducer.  The sound waves will harmlessly bounce off of your heart to allow the heart images to be captured in real-time motion. The images will be recorded on a computer. The procedure may vary among health care providers and hospitals. What happens after the procedure?  You may return to your normal, everyday life, including diet, activities, and medicines, unless your health care provider tells you not to do that. Summary  An echocardiogram is a procedure that uses painless sound waves (ultrasound) to produce an image of the heart.  Images from an echocardiogram can provide important information about the size and shape of your heart, heart muscle function, heart valve function, and fluid buildup around your heart.  You do not need to do anything to prepare before this procedure. You may eat and drink normally.  After the echocardiogram is completed, you may return to your normal, everyday life, unless your health care provider tells you not to do that. This information is not intended to replace advice given to you by your health care provider. Make sure you discuss any questions you have with your health care provider. Document Revised: 07/27/2018 Document Reviewed: 05/08/2016 Elsevier Patient Education  2020 [employer].

## 2019-08-10 NOTE — Progress Notes (Signed)
Follow-up Outpatient Visit Date: 08/10/2019  Primary Care Provider: Enid Baas, MD [address] [place] [address]  Chief Complaint: Follow-up pericarditis and mitral regurgitation  HPI:  Rebecca Graham is a 64 y.o. female with history of pericarditis, hypertension, sleep apnea, GERD, pneumonia, and overactive bladder, who presents for follow-up of pericarditis.  She was last seen in our office 2 months ago by Gillian Shields, NP, for follow-up of chest pain leading to ED visit in early February.  She was feeling well at that time with interval resolution of chest pain on colchicine.  She was advised to complete a 31-month course of colchicine.  Preceding echo showed normal LVEF with moderate mitral regurgitation.  Today, Rebecca Graham reports feeling well.  She has not had any further chest pain.  She also denies shortness of breath, palpitations, lightheadedness, and edema.  She ran out of colchicine today but had been tolerating the medication well.  --------------------------------------------------------------------------------------------------  Past Medical History:  Diagnosis Date  . GERD (gastroesophageal reflux disease)   . Hypertension   . Moderate mitral regurgitation 06/05/2019   (a) Echo 06/05/19 normal LVEF with moderate MR  . Pericarditis 05/22/2019   (a) treated with colchicine. no NSAID due to intolerance  . Pneumonia    Past Surgical History:  Procedure Laterality Date  . TUBAL LIGATION  1987    Recent CV Pertinent Labs: Lab Results  Component Value Date   K 3.9 05/22/2019   BUN 7 (L) 05/22/2019   BUN 12 05/01/2015   CREATININE 0.51 05/22/2019    Past medical and surgical history were reviewed and updated in EPIC.  Current Meds  Medication Sig  . fluconazole (DIFLUCAN) 150 MG tablet Take 1 tablet (150 mg total) by mouth daily.  . [DISCONTINUED] colchicine 0.6 MG tablet Take 1 tablet (0.6 mg total) by mouth 2 (two) times daily.     Allergies: Aspirin and Penicillins  Social History   Tobacco Use  . Smoking status: Current Every Day Smoker    Packs/day: 1.00    Years: 45.00    Pack years: 45.00    Types: Cigarettes  . Smokeless tobacco: Never Used  Substance Use Topics  . Alcohol use: Yes    Alcohol/week: 12.0 standard drinks    Types: 12 Cans of beer per week  . Drug use: No    Family History  Problem Relation Age of Onset  . Multiple sclerosis Mother   . Colon cancer Father   . Lung cancer Sister   . Hypertension Daughter     Review of Systems: A 12-system review of systems was performed and was negative except as noted in the HPI.  --------------------------------------------------------------------------------------------------  Physical Exam: BP 110/66 (BP Location: Left Arm, Patient Position: Sitting, Cuff Size: Normal)   Pulse 62   Ht 5\' 6"  (1.676 m)   Wt 134 lb 4 oz (60.9 kg)   SpO2 98%   BMI 21.67 kg/m   General:  NAD. Neck: No JVD or HJR. Lungs: CTA bilaterally. Heart: RRR w/o m/r/g. Abd: Soft, NT/ND Ext: No LE edema.  EKG:  NSR without abnormality.  Lab Results  Component Value Date   WBC 6.6 05/22/2019   HGB 14.4 05/22/2019   HCT 44.1 05/22/2019   MCV 97.6 05/22/2019   PLT 97 (L) 05/22/2019    Lab Results  Component Value Date   NA 141 05/22/2019   K 3.9 05/22/2019   CL 106 05/22/2019   CO2 25 05/22/2019   BUN 7 (L)  05/22/2019   CREATININE 0.51 05/22/2019   GLUCOSE 130 (H) 05/22/2019   ALT 45 08/15/2017    No results found for: CHOL, HDL, LDLCALC, LDLDIRECT, TRIG, CHOLHDL  --------------------------------------------------------------------------------------------------  ASSESSMENT AND PLAN: Pericarditis: Idiopathic with resolution of symptoms on colchicine.  She is almost 3 months out from the index event and has just run out of colchicine.  We will discontinue the medication at this time.  If she were to have a recurrence, I would recommend a 6 month  course followed by more gradual taper.  Mitral regurgitation: No signs or symptoms of heart failure.  We will repeat an echo in 1 year for revaluation and follow-up in the clinic shortly thereafter.  Tobacco use: The patient has cut down to 1 PPD and states that she will try to cut down further.  Follow-up: Return to clinic in 1 year.  Nelva Bush, MD 08/10/2019 8:25 PM

## 2019-08-13 ENCOUNTER — Encounter: Payer: Self-pay | Admitting: *Deleted

## 2019-08-20 ENCOUNTER — Ambulatory Visit
Admission: RE | Admit: 2019-08-20 | Discharge: 2019-08-20 | Disposition: A | Discharge status: Home / Self Care | Payer: Medicare Other | Source: Ambulatory Visit | Attending: Internal Medicine | Admitting: Internal Medicine

## 2019-08-20 DIAGNOSIS — Z1231 Encounter for screening mammogram for malignant neoplasm of breast: Secondary | ICD-10-CM | POA: Diagnosis present

## 2019-08-22 ENCOUNTER — Telehealth: Payer: Self-pay | Admitting: Internal Medicine

## 2019-08-22 NOTE — Telephone Encounter (Signed)
Patient returning call - unsure of who made call Please call if needed

## 2019-08-22 NOTE — Telephone Encounter (Signed)
Looks like scheduling called for echo appt. Echo order is for next year. Routing to scheduler.

## 2020-07-17 ENCOUNTER — Telehealth: Payer: Self-pay

## 2020-07-17 NOTE — Telephone Encounter (Signed)
Attempted to call patient for annual lung screening. No answer and no voicemail available. 

## 2020-08-04 ENCOUNTER — Telehealth: Payer: Self-pay | Admitting: *Deleted

## 2020-08-04 DIAGNOSIS — F17200 Nicotine dependence, unspecified, uncomplicated: Secondary | ICD-10-CM

## 2020-08-04 DIAGNOSIS — Z87891 Personal history of nicotine dependence: Secondary | ICD-10-CM

## 2020-08-04 DIAGNOSIS — Z122 Encounter for screening for malignant neoplasm of respiratory organs: Secondary | ICD-10-CM

## 2020-08-04 NOTE — Telephone Encounter (Signed)
Left voicemail message for patient to call me back re: scheduling her yearly lung screening scan.

## 2020-08-04 NOTE — Telephone Encounter (Signed)
Patient returned my call, yearly lung screening CT Scan scheduled for 08/15/20 @ 10:00 am. Current smoker, 1 ppd x 46 years.

## 2020-08-15 ENCOUNTER — Other Ambulatory Visit: Payer: Self-pay

## 2020-08-15 ENCOUNTER — Ambulatory Visit
Admission: RE | Admit: 2020-08-15 | Discharge: 2020-08-15 | Disposition: A | Discharge status: Home / Self Care | Payer: Medicare Other | Source: Ambulatory Visit | Attending: Oncology | Admitting: Oncology

## 2020-08-15 DIAGNOSIS — F17200 Nicotine dependence, unspecified, uncomplicated: Secondary | ICD-10-CM | POA: Insufficient documentation

## 2020-08-15 DIAGNOSIS — Z87891 Personal history of nicotine dependence: Secondary | ICD-10-CM | POA: Diagnosis present

## 2020-08-15 DIAGNOSIS — Z122 Encounter for screening for malignant neoplasm of respiratory organs: Secondary | ICD-10-CM | POA: Diagnosis present

## 2020-08-25 ENCOUNTER — Encounter: Payer: Self-pay | Admitting: *Deleted

## 2020-09-10 ENCOUNTER — Telehealth: Payer: Self-pay | Admitting: Internal Medicine

## 2020-09-10 NOTE — Telephone Encounter (Signed)
Patient has been contacted at least 3 times for a recall, recall has been removed 

## 2021-03-01 ENCOUNTER — Emergency Department: Payer: Medicare Other

## 2021-03-01 ENCOUNTER — Emergency Department
Admission: EM | Admit: 2021-03-01 | Discharge: 2021-03-01 | Disposition: A | Discharge status: Home / Self Care | Payer: Medicare Other | Attending: Emergency Medicine | Admitting: Emergency Medicine

## 2021-03-01 ENCOUNTER — Other Ambulatory Visit: Payer: Self-pay

## 2021-03-01 DIAGNOSIS — I10 Essential (primary) hypertension: Secondary | ICD-10-CM | POA: Diagnosis not present

## 2021-03-01 DIAGNOSIS — S32020A Wedge compression fracture of second lumbar vertebra, initial encounter for closed fracture: Secondary | ICD-10-CM | POA: Diagnosis not present

## 2021-03-01 DIAGNOSIS — W1830XA Fall on same level, unspecified, initial encounter: Secondary | ICD-10-CM | POA: Diagnosis not present

## 2021-03-01 DIAGNOSIS — F17210 Nicotine dependence, cigarettes, uncomplicated: Secondary | ICD-10-CM | POA: Insufficient documentation

## 2021-03-01 DIAGNOSIS — S34109A Unspecified injury to unspecified level of lumbar spinal cord, initial encounter: Secondary | ICD-10-CM | POA: Diagnosis present

## 2021-03-01 HISTORY — DX: Osteopetrosis: Q78.2

## 2021-03-01 MED ORDER — HYDROCODONE-ACETAMINOPHEN 5-325 MG PO TABS: 1.0000 | ORAL_TABLET | Freq: Four times a day (QID) | ORAL | 0 refills | Status: DC | PRN

## 2021-03-01 MED ORDER — HYDROCODONE-ACETAMINOPHEN 5-325 MG PO TABS: 1.0000 | ORAL_TABLET | Freq: Four times a day (QID) | ORAL | 0 refills | Status: AC | PRN

## 2021-03-01 MED ORDER — HYDROCODONE-ACETAMINOPHEN 5-325 MG PO TABS
1.0000 | ORAL_TABLET | Freq: Once | ORAL | Status: AC
  Administered 2021-03-01: 1 via ORAL
  Filled 2021-03-01: qty 1

## 2021-03-01 NOTE — ED Provider Notes (Signed)
[facility] Emergency Department Provider Note  ____________________________________________   Event Date/Time   First MD Initiated Contact with Patient 03/01/21 1004     (approximate)  I have reviewed the triage vital signs and the nursing notes.   HISTORY  Chief Complaint Fall and Back Pain   HPI Rebecca Graham is a 65 y.o. female presents to the ED with complaint of back pain.  Patient states that she was assisting someone in a wheelchair yesterday and fell backwards landing on her back.  Since that time she has continued to have back pain.  She denies any previous injury to her back.  She continues to ambulate however this is painful.  She has not take any over-the-counter medication for this.  Denies any head injury or loss of consciousness.  She rates her pain as 10/10.         Past Medical History:  Diagnosis Date   GERD (gastroesophageal reflux disease)    Hypertension    Moderate mitral regurgitation 06/05/2019   (a) Echo 06/05/19 normal LVEF with moderate MR   Osteopetrosis    Pericarditis 05/22/2019   (a) treated with colchicine. no NSAID due to intolerance   Pneumonia     Patient Active Problem List   Diagnosis Date Noted   Acute idiopathic pericarditis 06/08/2019   Mitral valve insufficiency 06/08/2019   Tobacco use 06/08/2019   Essential hypertension 05/08/2015   OAB (overactive bladder) 05/08/2015    Past Surgical History:  Procedure Laterality Date   TUBAL LIGATION  1987    Prior to Admission medications   Medication Sig Start Date End Date Taking? Authorizing Provider  HYDROcodone-acetaminophen (NORCO/VICODIN) 5-325 MG tablet Take 1 tablet by mouth every 6 (six) hours as needed for moderate pain. 03/01/21 03/01/22 Yes Asa Fath L, PA-C  fluconazole (DIFLUCAN) 150 MG tablet Take 1 tablet (150 mg total) by mouth daily. 02/06/19   Emily Filbert, MD    Allergies Aspirin and Penicillins  Family History   Problem Relation Age of Onset   Multiple sclerosis Mother    Colon cancer Father    Lung cancer Sister    Hypertension Daughter     Social History Social History   Tobacco Use   Smoking status: Every Day    Packs/day: 1.00    Years: 45.00    Pack years: 45.00    Types: Cigarettes   Smokeless tobacco: Never  Vaping Use   Vaping Use: Never used  Substance Use Topics   Alcohol use: Yes    Alcohol/week: 12.0 standard drinks    Types: 12 Cans of beer per week   Drug use: No    Review of Systems Constitutional: No fever/chills Eyes: No visual changes. ENT: No trauma. Cardiovascular: Denies chest pain. Respiratory: Denies shortness of breath. Gastrointestinal: No abdominal pain.  No nausea, no vomiting.  No diarrhea.   Genitourinary: Negative for dysuria. Musculoskeletal: Positive for low back pain. Skin: Negative for rash. Neurological: Negative for headaches, focal weakness or numbness. ____________________________________________   PHYSICAL EXAM:  VITAL SIGNS: ED Triage Vitals [03/01/21 0854]  Enc Vitals Group     BP 107/75     Pulse Rate 89     Resp 16     Temp 97.9 F (36.6 C)     Temp Source Oral     SpO2 95 %     Weight 140 lb (63.5 kg)     Height 5\' 7"  (1.702 m)     Head Circumference  Peak Flow      Pain Score 10     Pain Loc      Pain Edu?      Excl. in GC?     Constitutional: Alert and oriented. Well appearing and in no acute distress. Eyes: Conjunctivae are normal.  Head: Atraumatic. Nose: No trauma. Neck: No stridor.  No cervical tenderness on palpation posteriorly.  Range of motion without restriction. Cardiovascular: Normal rate, regular rhythm. Grossly normal heart sounds.  Good peripheral circulation. Respiratory: Normal respiratory effort.  No retractions. Lungs CTAB. Gastrointestinal: Soft and nontender. No distention.  Nontender.  Bowel sounds normoactive x4 quadrants. Musculoskeletal: Tenderness is noted on palpation of the  thoracic spine however the upper lumbar and L5-S1 area patient is markedly tender to palp patient.  Range of motion is restricted and difficult due to increased pain with range of motion.  Straight leg raises were negative.  Good muscle strength bilaterally at 5/5.  Motor sensory function intact. Neurologic:  Normal speech and language. No gross focal neurologic deficits are appreciated.  Skin:  Skin is warm, dry and intact.  Psychiatric: Mood and affect are normal. Speech and behavior are normal.  ____________________________________________   LABS (all labs ordered are listed, but only abnormal results are displayed)  Labs Reviewed - No data to display ____________________________________________  RADIOLOGY I, Tommi Rumps, personally viewed and evaluated these images (plain radiographs) as part of my medical decision making, as well as reviewing the written report by the radiologist.   Official radiology report(s): DG Lumbar Spine 2-3 Views  Result Date: 03/01/2021 CLINICAL DATA:  Status post fall with injury to the back. EXAM: LUMBAR SPINE - 2-3 VIEW COMPARISON:  None. FINDINGS: There is 25% compression deformity of L2 vertebral body. There is no dislocation. IMPRESSION: 25% compression deformity of L2 vertebral body, age indeterminate. Electronically Signed   By: Sherian Rein M.D.   On: 03/01/2021 11:19   CT Lumbar Spine Wo Contrast  Result Date: 03/01/2021 CLINICAL DATA:  Spine fracture, traumatic suspected compression fracture of L2 vertebral body EXAM: CT LUMBAR SPINE WITHOUT CONTRAST TECHNIQUE: Multidetector CT imaging of the lumbar spine was performed without intravenous contrast administration. Multiplanar CT image reconstructions were also generated. COMPARISON:  Radiograph performed earlier on the same date. FINDINGS: Segmentation: 5 lumbar type vertebrae. Alignment: Normal. Vertebrae: Compression deformity of the L2 vertebral body with approximately 30% anterior vertebral  body height loss. Minimal retropulsion into the spinal canal without significant spinal canal narrowing. Paraspinal and other soft tissues: Negative. Disc levels: Mild multilevel DA and disc disease prominent at L5-S1. Multilevel facet joint arthropathy. IMPRESSION: Superior endplate compression deformity of L2 vertebral body with approximately 30% anterior vertebral height loss, no evidence of surrounding edema, likely representing subacute to chronic process. Minimal retropulsion into the spinal canal without spinal canal narrowing. 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 Electronically Signed   By: Larose Hires D.O.   On: 03/01/2021 12:39    ____________________________________________   PROCEDURES  Procedure(s) performed (including Critical Care):  Procedures   ____________________________________________   INITIAL IMPRESSION / ASSESSMENT AND PLAN / ED COURSE  As part of my medical decision making, I reviewed the following data within the electronic MEDICAL RECORD NUMBER Notes from prior ED visits and [place] Controlled Substance Database  65 year old female presents to the ED with low back pain after she fell backwards assisting someone and a wheelchair.  She denies any head injury or loss of consciousness during this  but has continued to have low back pain.  She has not taken any over-the-counter medication for this.  Physical exam she is moderately to markedly tender to the lumbar area on palpation.  X-ray shows a 25% compression fracture at L2 with age undetermined.  CT scan shows L2 to be compressed at 30%.  I discussed following up with Dr. Rosita Kea as he is the orthopedist in the area that would possibly do a procedure on patient should she elect to do this.  Patient was given hydrocodone while in the ED.  She is aware that she has a prescription at her pharmacy to pick up for the same medication.  She was also told that this medication could  cause drowsiness and increase her risk for falling and take with food to avoid stomach upset. ____________________________________________   FINAL CLINICAL IMPRESSION(S) / ED DIAGNOSES  Final diagnoses:  Compression fracture of L2 vertebra, initial encounter [facility])     ED Discharge Orders          Ordered    HYDROcodone-acetaminophen (NORCO/VICODIN) 5-325 MG tablet  Every 6 hours PRN        03/01/21 1252             Note:  This document was prepared using Dragon voice recognition software and may include unintentional dictation errors.    Tommi Rumps, PA-C 03/01/21 1310    Jene Every, MD 03/01/21 1325

## 2021-03-01 NOTE — Discharge Instructions (Addendum)
Call make appointment with Dr. Neomia Glass contact information and office address is listed on your discharge papers.  You may use ice to your back as needed for discomfort.  A prescription for pain medication was sent to your pharmacy.  Be aware that this medication could cause drowsiness and increase your risk for falling.  This should be taken with food.

## 2021-03-01 NOTE — ED Triage Notes (Signed)
Pt states she was assisting someone with a wheel chair yesterday and fell backward with the wheel chair falling on top of her in the mud, pt c/o lower back pain

## 2021-03-01 NOTE — ED Notes (Signed)
Dc ppw provided. Pt follow up and rx information provided. Pt assisted off unit via wheelchair. Pt signed dc consent. Pt denies questions.

## 2021-03-06 ENCOUNTER — Other Ambulatory Visit: Payer: Self-pay | Admitting: Orthopedic Surgery

## 2021-03-06 DIAGNOSIS — S32020A Wedge compression fracture of second lumbar vertebra, initial encounter for closed fracture: Secondary | ICD-10-CM

## 2021-03-06 DIAGNOSIS — M8008XA Age-related osteoporosis with current pathological fracture, vertebra(e), initial encounter for fracture: Secondary | ICD-10-CM

## 2021-03-20 ENCOUNTER — Ambulatory Visit
Admission: RE | Admit: 2021-03-20 | Discharge: 2021-03-20 | Disposition: A | Discharge status: Home / Self Care | Payer: Medicare Other | Source: Ambulatory Visit | Attending: Orthopedic Surgery | Admitting: Orthopedic Surgery

## 2021-03-20 DIAGNOSIS — M8008XA Age-related osteoporosis with current pathological fracture, vertebra(e), initial encounter for fracture: Secondary | ICD-10-CM | POA: Diagnosis present

## 2021-03-20 DIAGNOSIS — S32020A Wedge compression fracture of second lumbar vertebra, initial encounter for closed fracture: Secondary | ICD-10-CM

## 2021-08-06 ENCOUNTER — Telehealth: Payer: Self-pay | Admitting: Acute Care

## 2021-08-06 NOTE — Telephone Encounter (Signed)
Attempted to reach pt for  annual LDCT-LVMM. ?

## 2022-06-16 NOTE — Congregational Nurse Program (Signed)
  Dept: [place] Nurse Program Note  Date of Encounter: 06/16/2022  Client requested BP visit today. Has blood pressure at home, checks every 2-3 days.  Not on any medications. Taking Tylenol for tooth pain, plans to make dental appointment this week. Has a PCP with [facility] in [place]. Client plans to call and schedule an annual appointment and to get Shingles vaccine.  Past Medical History: Past Medical History:  Diagnosis Date   GERD (gastroesophageal reflux disease)    Hypertension    Moderate mitral regurgitation 06/05/2019   (a) Echo 06/05/19 normal LVEF with moderate MR   Osteopetrosis    Pericarditis 05/22/2019   (a) treated with colchicine. no NSAID due to intolerance   Pneumonia     Encounter Details:  CNP Questionnaire - 06/16/22 1320       Questionnaire   Ask client: Do you give verbal consent for me to treat you today? Yes    Student Assistance N/A    Location Patient Film/video editor, [employer]    Visit Setting with Science writer    Patient Status Unknown    Equities trader Yes    Medical Referrals Made N/A    Medical Appointment Made N/A    Recently w/o PCP, now 1st time PCP visit completed due to CNs referral or appointment made N/A    Food Have Food Insecurities    Transportation N/A    Housing/Utilities N/A    Interpersonal Safety N/A    Interventions Advocate/Support;Educate    Abnormal to Normal Screening Since Last CN Visit N/A    Screenings CN Performed Blood Pressure    Sent Client to Lab for: N/A    Did client attend any of the following based off CNs referral or appointments made? N/A    ED Visit Averted N/A    Life-Saving Intervention Made N/A

## 2022-06-18 ENCOUNTER — Other Ambulatory Visit: Payer: Self-pay | Admitting: Internal Medicine

## 2022-06-18 DIAGNOSIS — Z1231 Encounter for screening mammogram for malignant neoplasm of breast: Secondary | ICD-10-CM

## 2022-07-29 ENCOUNTER — Ambulatory Visit
Admission: RE | Admit: 2022-07-29 | Discharge: 2022-07-29 | Disposition: A | Discharge status: Home / Self Care | Payer: Medicare Other | Source: Ambulatory Visit | Attending: Internal Medicine | Admitting: Internal Medicine

## 2022-07-29 DIAGNOSIS — Z1231 Encounter for screening mammogram for malignant neoplasm of breast: Secondary | ICD-10-CM | POA: Diagnosis present

## 2023-03-06 ENCOUNTER — Emergency Department: Payer: Medicare Other

## 2023-03-06 ENCOUNTER — Other Ambulatory Visit: Payer: Self-pay

## 2023-03-06 DIAGNOSIS — I10 Essential (primary) hypertension: Secondary | ICD-10-CM | POA: Diagnosis not present

## 2023-03-06 DIAGNOSIS — W19XXXA Unspecified fall, initial encounter: Secondary | ICD-10-CM | POA: Diagnosis not present

## 2023-03-06 DIAGNOSIS — M25512 Pain in left shoulder: Secondary | ICD-10-CM | POA: Diagnosis not present

## 2023-03-06 DIAGNOSIS — S4992XA Unspecified injury of left shoulder and upper arm, initial encounter: Secondary | ICD-10-CM | POA: Diagnosis present

## 2023-03-06 NOTE — ED Triage Notes (Signed)
Pt reports trying to get up on her bed and falling over and hitting wall with left shoulder. Pt reports pain to shoulder and denies any other injury. Did not hit head, no LOC and no blood thinner use. Pt has strong left radial pulse.

## 2023-03-07 ENCOUNTER — Emergency Department
Admission: EM | Admit: 2023-03-07 | Discharge: 2023-03-07 | Disposition: A | Discharge status: Home / Self Care | Payer: Medicare Other | Attending: Emergency Medicine | Admitting: Emergency Medicine

## 2023-03-07 DIAGNOSIS — W19XXXA Unspecified fall, initial encounter: Secondary | ICD-10-CM

## 2023-03-07 DIAGNOSIS — M25512 Pain in left shoulder: Secondary | ICD-10-CM

## 2023-03-07 MED ORDER — OXYCODONE-ACETAMINOPHEN 5-325 MG PO TABS: 1.0000 | ORAL_TABLET | ORAL | 0 refills | Status: AC | PRN

## 2023-03-07 MED ORDER — OXYCODONE-ACETAMINOPHEN 5-325 MG PO TABS
1.0000 | ORAL_TABLET | Freq: Once | ORAL | Status: AC
  Administered 2023-03-07: 1 via ORAL
  Filled 2023-03-07: qty 1

## 2023-03-07 NOTE — Discharge Instructions (Signed)
You may take Percocet as needed for pain. Wear sling as needed for comfort. Return to the ER for worsening symptoms, persistent vomiting, difficulty breathing or other concerns.

## 2023-03-07 NOTE — ED Provider Notes (Signed)
[facility] Provider Note    Event Date/Time   First MD Initiated Contact with Patient 03/07/23 0206     (approximate)   History   Fall and Shoulder Pain   HPI  Rebecca Graham is a 67 y.o. female who presents to the ED from home with a chief complaint of left shoulder injury.  Patient was hopping into her bed, fell over and struck a wall.  Denies striking head or LOC.  Presents with left shoulder pain.  Voices no other complaints or injuries.     Past Medical History   Past Medical History:  Diagnosis Date   GERD (gastroesophageal reflux disease)    Hypertension    Moderate mitral regurgitation 06/05/2019   (a) Echo 06/05/19 normal LVEF with moderate MR   Osteopetrosis    Pericarditis 05/22/2019   (a) treated with colchicine. no NSAID due to intolerance   Pneumonia      Active Problem List   Patient Active Problem List   Diagnosis Date Noted   Acute idiopathic pericarditis 06/08/2019   Mitral valve insufficiency 06/08/2019   Tobacco use 06/08/2019   Essential hypertension 05/08/2015   OAB (overactive bladder) 05/08/2015     Past Surgical History   Past Surgical History:  Procedure Laterality Date   TUBAL LIGATION  1987     Home Medications   Prior to Admission medications   Medication Sig Start Date End Date Taking? Authorizing Provider  oxyCODONE-acetaminophen (PERCOCET/ROXICET) 5-325 MG tablet Take 1 tablet by mouth every 4 (four) hours as needed for severe pain (pain score 7-10). 03/07/23  Yes Irean Hong, MD  fluconazole (DIFLUCAN) 150 MG tablet Take 1 tablet (150 mg total) by mouth daily. 02/06/19   Emily Filbert, MD     Allergies  Aspirin and Penicillins   Family History   Family History  Problem Relation Age of Onset   Multiple sclerosis Mother    Colon cancer Father    Lung cancer Sister    Hypertension Daughter      Physical Exam  Triage Vital Signs: ED Triage Vitals  Encounter Vitals Group      BP 03/06/23 2230 105/72     Systolic BP Percentile --      Diastolic BP Percentile --      Pulse Rate 03/06/23 2229 71     Resp 03/06/23 2229 18     Temp 03/06/23 2229 98 F (36.7 C)     Temp src --      SpO2 03/06/23 2229 95 %     Weight 03/06/23 2229 145 lb (65.8 kg)     Height --      Head Circumference --      Peak Flow --      Pain Score 03/06/23 2229 10     Pain Loc --      Pain Education --      Exclude from Growth Chart --     Updated Vital Signs: BP 105/72   Pulse 71   Temp 98 F (36.7 C)   Resp 18   Wt 65.8 kg   SpO2 95%   BMI 22.71 kg/m    General: Awake, no distress.  CV:  RRR.  Good peripheral perfusion.  Resp:  Normal effort.  TAB. Abd:  No distention.  Other:  LUE: No deformities noted.  Tender to palpation shoulder.  Limited range of motion secondary to pain.  2+ radial pulse.  Brisk, less than 5-second cap  refill.  5/5 grip strength.   ED Results / Procedures / Treatments  Labs (all labs ordered are listed, but only abnormal results are displayed) Labs Reviewed - No data to display   EKG  None   RADIOLOGY I have independently visualized and interpreted patient's imaging study as well as noted the radiology interpretation:  Left shoulder: No acute traumatic injury  Official radiology report(s): DG Shoulder Left  Result Date: 03/06/2023 CLINICAL DATA:  Shoulder pain EXAM: LEFT SHOULDER - 2+ VIEW COMPARISON:  None Available. FINDINGS: Degenerative changes in the [facility] joint with joint space narrowing and spurring. Glenohumeral joint is maintained. No acute bony abnormality. Specifically, no fracture, subluxation, or dislocation. Soft tissues are intact. IMPRESSION: Degenerative changes in the left AC joint. No acute bony abnormality. Electronically Signed   By: Charlett Nose M.D.   On: 03/06/2023 23:25     PROCEDURES:  Critical Care performed: No  Procedures   MEDICATIONS ORDERED IN ED: Medications  oxyCODONE-acetaminophen  (PERCOCET/ROXICET) 5-325 MG per tablet 1 tablet (has no administration in time range)     IMPRESSION / MDM / ASSESSMENT AND PLAN / ED COURSE  I reviewed the triage vital signs and the nursing notes.                             67 year old female presenting with left shoulder injury.  X-rays negative for acute osseous fracture.  Will place in sling, administer analgesia and patient will follow-up with orthopedics as needed.  Strict return precautions given.  Patient verbalizes understanding and agrees with plan of care.  Patient's presentation is most consistent with acute, uncomplicated illness.   FINAL CLINICAL IMPRESSION(S) / ED DIAGNOSES   Final diagnoses:  Fall, initial encounter  Acute pain of left shoulder     Rx / DC Orders   ED Discharge Orders          Ordered    oxyCODONE-acetaminophen (PERCOCET/ROXICET) 5-325 MG tablet  Every 4 hours PRN        03/07/23 0211             Note:  This document was prepared using Dragon voice recognition software and may include unintentional dictation errors.   Irean Hong, MD 03/07/23 [phone]
